# Patient Record
Sex: Female | Born: 1990 | Race: White | Hispanic: No | Marital: Single | Smoking: Never smoker
Health system: Southern US, Community
[De-identification: ages and names within clinical notes are randomized; demographics above are authoritative.]

## PROBLEM LIST (undated history)

## (undated) DIAGNOSIS — R51 Headache: Secondary | ICD-10-CM

## (undated) HISTORY — PX: NO PAST SURGERIES: SHX2092

---
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
## 2012-11-20 IMAGING — CR DG CHEST 2V
2 series · 2 of 2 positions shown · non-contrast
Comparison: None.

CLINICAL DATA: Postpartum.  Cough, sore throat, and fever

CHEST - 2 VIEW

[view not recorded (1 of 2)]
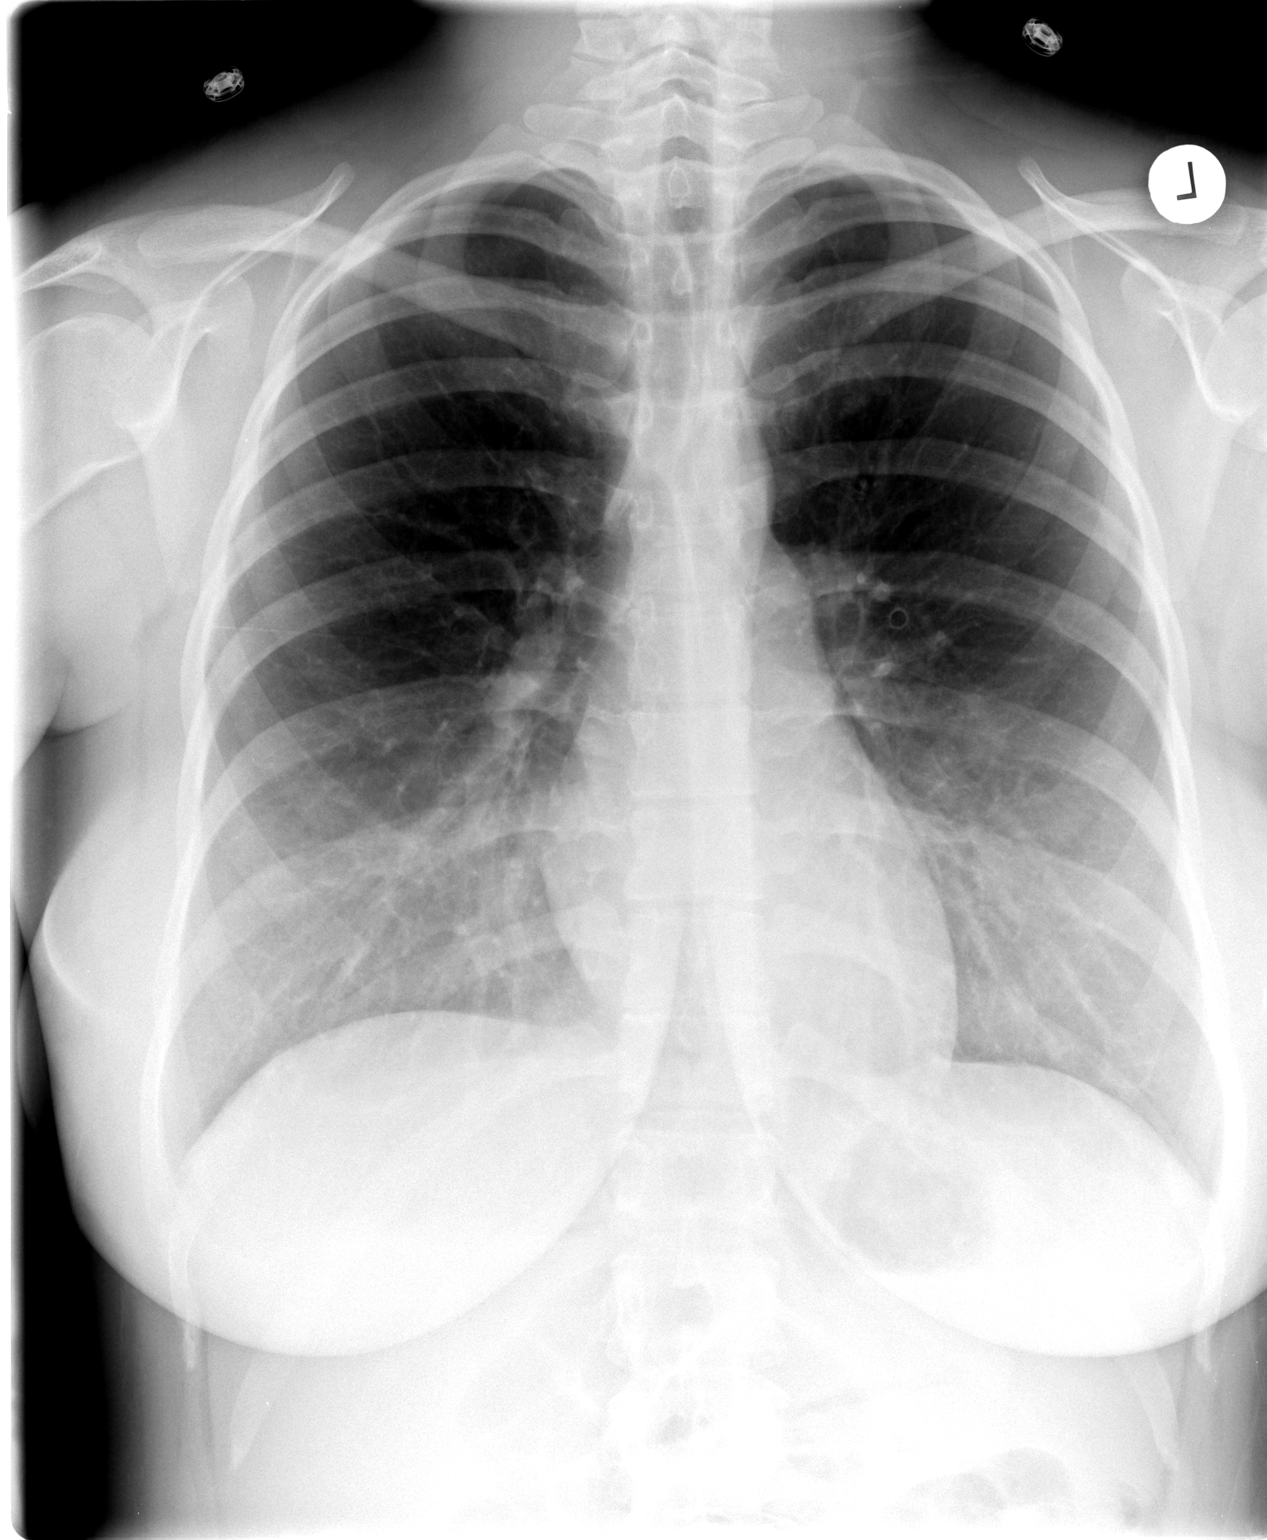

[view not recorded (2 of 2)]
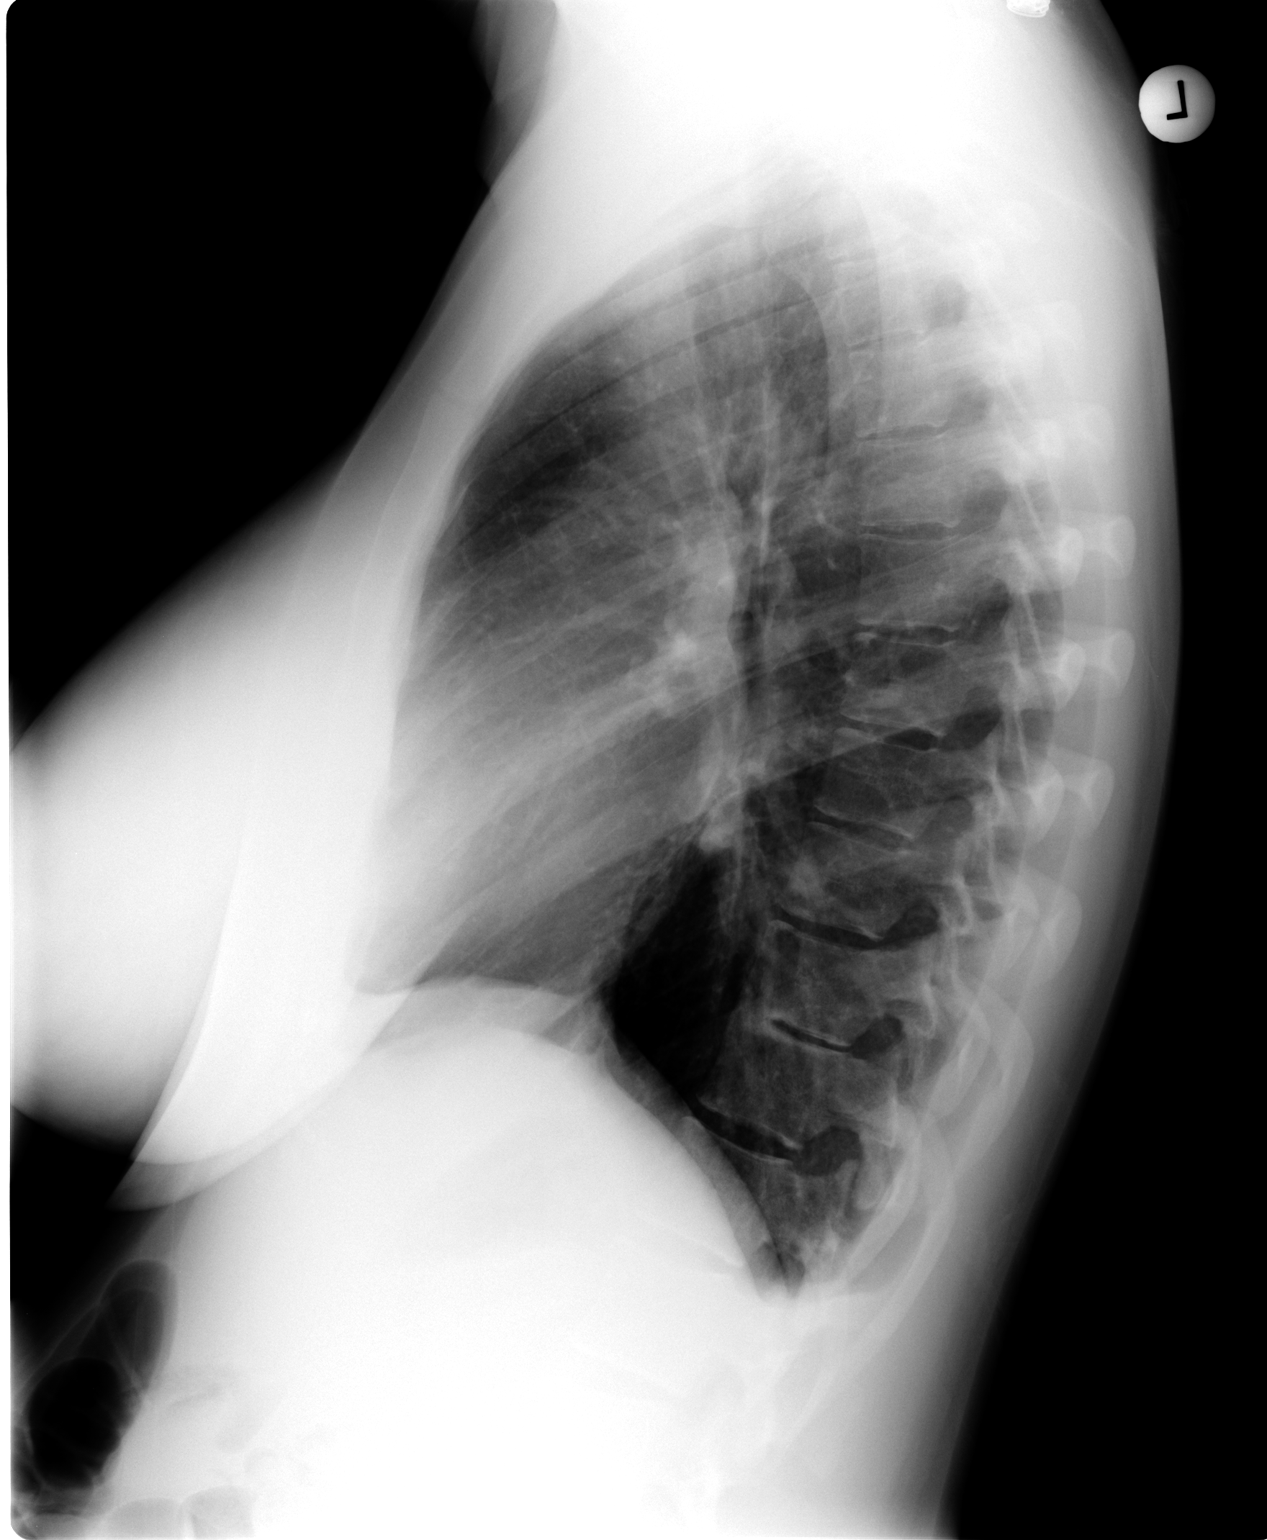

[2 of 2 positions shown; findings below may reference images not displayed]

FINDINGS: Normal heart, mediastinal, and hilar contours.  The lungs
are normally expanded and clear.  There is no pleural effusion or
pneumothorax.  The trachea is midline.  Bones are unremarkable.
IMPRESSION: No acute cardiopulmonary disease.

## 2012-12-28 ENCOUNTER — Encounter (HOSPITAL_COMMUNITY): Payer: Self-pay | Admitting: Emergency Medicine

## 2012-12-28 ENCOUNTER — Emergency Department (HOSPITAL_COMMUNITY)
Admission: EM | Admit: 2012-12-28 | Discharge: 2012-12-28 | Disposition: A | Discharge status: Home / Self Care | Payer: Self-pay | Attending: Emergency Medicine | Admitting: Emergency Medicine

## 2012-12-28 DIAGNOSIS — N390 Urinary tract infection, site not specified: Secondary | ICD-10-CM | POA: Insufficient documentation

## 2012-12-28 DIAGNOSIS — R350 Frequency of micturition: Secondary | ICD-10-CM | POA: Insufficient documentation

## 2012-12-28 DIAGNOSIS — R05 Cough: Secondary | ICD-10-CM | POA: Insufficient documentation

## 2012-12-28 DIAGNOSIS — R197 Diarrhea, unspecified: Secondary | ICD-10-CM | POA: Insufficient documentation

## 2012-12-28 DIAGNOSIS — R059 Cough, unspecified: Secondary | ICD-10-CM | POA: Insufficient documentation

## 2012-12-28 DIAGNOSIS — B349 Viral infection, unspecified: Secondary | ICD-10-CM

## 2012-12-28 DIAGNOSIS — Z3202 Encounter for pregnancy test, result negative: Secondary | ICD-10-CM | POA: Insufficient documentation

## 2012-12-28 DIAGNOSIS — B9789 Other viral agents as the cause of diseases classified elsewhere: Secondary | ICD-10-CM | POA: Insufficient documentation

## 2012-12-28 DIAGNOSIS — R112 Nausea with vomiting, unspecified: Secondary | ICD-10-CM | POA: Insufficient documentation

## 2012-12-28 LAB — COMPREHENSIVE METABOLIC PANEL
ALT: 16 U/L (ref 0–35)
AST: 26 U/L (ref 0–37)
Albumin: 4.2 g/dL (ref 3.5–5.2)
CO2: 23 mEq/L (ref 19–32)
Chloride: 104 mEq/L (ref 96–112)
GFR calc non Af Amer: >90 mL/min (ref >90)
Sodium: 138 mEq/L (ref 135–145)
Total Bilirubin: 0.3 mg/dL (ref 0.3–1.2)

## 2012-12-28 LAB — URINALYSIS, ROUTINE W REFLEX MICROSCOPIC
Glucose, UA: NEGATIVE mg/dL
Nitrite: POSITIVE — AB
Protein, ur: NEGATIVE mg/dL
Urobilinogen, UA: 1 mg/dL (ref 0.0–1.0)

## 2012-12-28 LAB — URINE MICROSCOPIC-ADD ON

## 2012-12-28 LAB — CBC WITH DIFFERENTIAL/PLATELET
Basophils Absolute: 0 10*3/uL (ref 0.0–0.1)
Lymphocytes Relative: 27 % (ref 12–46)
Neutro Abs: 4.9 10*3/uL (ref 1.7–7.7)
Neutrophils Relative %: 65 % (ref 43–77)
Platelets: 156 10*3/uL (ref 150–400)
RDW: 12.5 % (ref 11.5–15.5)
WBC: 7.6 10*3/uL (ref 4.0–10.5)

## 2012-12-28 MED ORDER — METRONIDAZOLE 500 MG PO TABS: 500.0000 mg | ORAL_TABLET | Freq: Two times a day (BID) | ORAL | Status: DC

## 2012-12-28 MED ORDER — POTASSIUM CHLORIDE CRYS ER 20 MEQ PO TBCR
40.0000 meq | EXTENDED_RELEASE_TABLET | Freq: Once | ORAL | Status: AC
  Administered 2012-12-28: 40 meq via ORAL
  Filled 2012-12-28: qty 2

## 2012-12-28 MED ORDER — PROMETHAZINE HCL 25 MG PO TABS: 25.0000 mg | ORAL_TABLET | Freq: Four times a day (QID) | ORAL | Status: DC | PRN

## 2012-12-28 MED ORDER — NITROFURANTOIN MONOHYD MACRO 100 MG PO CAPS: 100.0000 mg | ORAL_CAPSULE | Freq: Two times a day (BID) | ORAL | Status: DC

## 2012-12-28 MED ORDER — ONDANSETRON 4 MG PO TBDP
8.0000 mg | ORAL_TABLET | Freq: Once | ORAL | Status: AC
  Administered 2012-12-28: 8 mg via ORAL
  Filled 2012-12-28: qty 2

## 2012-12-28 NOTE — ED Provider Notes (Signed)
History     CSN: [id number]  Arrival date & time 12/28/12  7829   First MD Initiated Contact with Patient 12/28/12 2031      Chief Complaint  Patient presents with  . Emesis   HPI   history provided by the patient. Patient is a 22 year old female with no significant PMH who presents with complaints of nausea vomiting and diarrhea. Symptoms first began 2 days ago with worse symptoms yesterday and persisting into today. She denies any known sick contacts. Does report being ill with upper respiratory infection one month ago and states he has had some continued issues of congestion and drainage. Denies any undercooked or spoiled foods. No recent travel. Symptoms came on acutely. Diarrhea is soft and mushy without blood or mucus. Patient has been able to keep down some fluids but also vomits when drinking he any solid foods. Denies any hematemesis. Denies any significant abdominal pains but does report some bloating and increased gas. No other aggravating or alleviating factors. No other associated symptoms.    Past Medical History  Diagnosis Date  . Headache     Past Surgical History  Procedure Laterality Date  . No past surgeries      No family history on file.  History  Substance Use Topics  . Smoking status: Never Smoker   . Smokeless tobacco: Never Used  . Alcohol Use: No    OB History   Grav Para Term Preterm Abortions TAB SAB Ect Mult Living   1 1 1       1       Review of Systems  Constitutional: Negative for fever and chills.  HENT: Negative for sore throat.   Respiratory: Positive for cough. Negative for shortness of breath.   Gastrointestinal: Positive for nausea, vomiting and diarrhea. Negative for abdominal pain and blood in stool.  Genitourinary: Positive for frequency. Negative for dysuria, hematuria, flank pain, vaginal bleeding and vaginal discharge.  All other systems reviewed and are negative.    Allergies  Review of patient's allergies indicates no  known allergies.  Home Medications   Current Outpatient Rx  Name  Route  Sig  Dispense  Refill  . prenatal vitamin w/FE, FA (PRENATAL 1 + 1) 27-1 MG TABS   Oral   Take 1 tablet by mouth daily.             BP 150/81  Pulse 75  Temp(Src) 98.2 F (36.8 C) (Oral)  SpO2 100%  LMP 11/30/2012  Physical Exam  Nursing note and vitals reviewed. Constitutional: She is oriented to person, place, and time. She appears well-developed and well-nourished. No distress.  HENT:  Head: Normocephalic.  Mouth/Throat: Oropharynx is clear and moist.  Neck: Normal range of motion. Neck supple.  No meningeal signs  Cardiovascular: Normal rate and regular rhythm.   Pulmonary/Chest: Effort normal and breath sounds normal. No respiratory distress. She has no wheezes. She has no rales.  Abdominal: Soft. There is no tenderness. There is no rebound and no guarding.  Musculoskeletal: Normal range of motion.  Neurological: She is alert and oriented to person, place, and time.  Skin: Skin is warm and dry. No rash noted.  Psychiatric: She has a normal mood and affect. Her behavior is normal.    ED Course  Procedures   Results for orders placed during the hospital encounter of 12/28/12  URINALYSIS, ROUTINE W REFLEX MICROSCOPIC      Result Value Range   Color, Urine YELLOW  YELLOW   APPearance  HAZY (*) CLEAR   Specific Gravity, Urine 1.022  1.005 - 1.030   pH 6.5  5.0 - 8.0   Glucose, UA NEGATIVE  NEGATIVE mg/dL   Hgb urine dipstick NEGATIVE  NEGATIVE   Bilirubin Urine NEGATIVE  NEGATIVE   Ketones, ur NEGATIVE  NEGATIVE mg/dL   Protein, ur NEGATIVE  NEGATIVE mg/dL   Urobilinogen, UA 1.0  0.0 - 1.0 mg/dL   Nitrite POSITIVE (*) NEGATIVE   Leukocytes, UA SMALL (*) NEGATIVE  CBC WITH DIFFERENTIAL      Result Value Range   WBC 7.6  4.0 - 10.5 K/uL   RBC 4.65  3.87 - 5.11 MIL/uL   Hemoglobin 14.8  12.0 - 15.0 g/dL   HCT 16.1  09.6 - 04.5 %   MCV 90.3  78.0 - 100.0 fL   MCH 31.8  26.0 - 34.0 pg    MCHC 35.2  30.0 - 36.0 g/dL   RDW 40.9  81.1 - 91.4 %   Platelets 156  150 - 400 K/uL   Neutrophils Relative 65  43 - 77 %   Neutro Abs 4.9  1.7 - 7.7 K/uL   Lymphocytes Relative 27  12 - 46 %   Lymphs Abs 2.0  0.7 - 4.0 K/uL   Monocytes Relative 6  3 - 12 %   Monocytes Absolute 0.4  0.1 - 1.0 K/uL   Eosinophils Relative 2  0 - 5 %   Eosinophils Absolute 0.2  0.0 - 0.7 K/uL   Basophils Relative 1  0 - 1 %   Basophils Absolute 0.0  0.0 - 0.1 K/uL  COMPREHENSIVE METABOLIC PANEL      Result Value Range   Sodium 138  135 - 145 mEq/L   Potassium 3.3 (*) 3.5 - 5.1 mEq/L   Chloride 104  96 - 112 mEq/L   CO2 23  19 - 32 mEq/L   Glucose, Bld 113 (*) 70 - 99 mg/dL   BUN 13  6 - 23 mg/dL   Creatinine, Ser 7.82  0.50 - 1.10 mg/dL   Calcium 9.2  8.4 - 95.6 mg/dL   Total Protein 7.0  6.0 - 8.3 g/dL   Albumin 4.2  3.5 - 5.2 g/dL   AST 26  0 - 37 U/L   ALT 16  0 - 35 U/L   Alkaline Phosphatase 57  39 - 117 U/L   Total Bilirubin 0.3  0.3 - 1.2 mg/dL   GFR calc non Af Amer >90  >90 mL/min   GFR calc Af Amer >90  >90 mL/min  URINE MICROSCOPIC-ADD ON      Result Value Range   Squamous Epithelial / LPF FEW (*) RARE   WBC, UA 0-2  <3 WBC/hpf   Bacteria, UA MANY (*) RARE   Urine-Other MUCOUS PRESENT    POCT PREGNANCY, URINE      Result Value Range   Preg Test, Ur NEGATIVE  NEGATIVE         1. Nausea vomiting and diarrhea   2. UTI (lower urinary tract infection)   3. Viral syndrome       MDM  9:10PM patient seen and evaluated. Patient appears well in no acute distress. Does not appear severely ill or toxic.  Slight hypokalemia. Potassium given. Patient does report slight urinary frequency and urgency. UA with early signs of UTI. Will give prescription. Patient states she tends to develop BV after treatment for her urinary tract infections in the past. Will also give small  course of Flagyl if she develops symptoms.      Angus Seller, PA-C 12/28/12 2204

## 2012-12-28 NOTE — Discharge Instructions (Signed)
You were seen and evaluated for your nausea vomiting and diarrhea symptoms. Your urine test today showed signs concerning for possible early urinary tract infection. You have been given prescription for medications to help with this infection as well as prescriptions to help with your symptoms of nausea vomiting and diarrhea. Continue drink plenty of fluids to stay hydrated. Followup with her primary care provider for continued evaluation and treatment.    Viral Syndrome You or your child has Viral Syndrome. It is the most common infection causing "colds" and infections in the nose, throat, sinuses, and breathing tubes. Sometimes the infection causes nausea, vomiting, or diarrhea. The germ that causes the infection is a virus. No antibiotic or other medicine will kill it. There are medicines that you can take to make you or your child more comfortable.  HOME CARE INSTRUCTIONS   Rest in bed until you start to feel better.  If you have diarrhea or vomiting, eat small amounts of crackers and toast. Soup is helpful.  Do not give aspirin or medicine that contains aspirin to children.  Only take over-the-counter or prescription medicines for pain, discomfort, or fever as directed by your caregiver. SEEK IMMEDIATE MEDICAL CARE IF:   You or your child has not improved within one week.  You or your child has pain that is not at least partially relieved by over-the-counter medicine.  Thick, colored mucus or blood is coughed up.  Discharge from the nose becomes thick yellow or green.  Diarrhea or vomiting gets worse.  There is any major change in your or your child's condition.  You or your child develops a skin rash, stiff neck, severe headache, or are unable to hold down food or fluid.  You or your child has an oral temperature above 102 F (38.9 C), not controlled by medicine.  Your baby is older than 3 months with a rectal temperature of 102 F (38.9 C) or higher.  Your baby is 30 months  old or younger with a rectal temperature of 100.4 F (38 C) or higher. Document Released: 09/07/2006 Document Revised: 12/15/2011 Document Reviewed: 09/08/2007 [facility] Patient Information 2013 [place], [place].      Urinary Tract Infection A urinary tract infection (UTI) is often caused by a germ (bacteria). A UTI is usually helped with medicine (antibiotics) that kills germs. Take all the medicine until it is gone. Do this even if you are feeling better. You are usually better in 7 to 10 days. HOME CARE   Drink enough water and fluids to keep your pee (urine) clear or pale yellow. Drink:  Cranberry juice.  Water.  Avoid:  Caffeine.  Tea.  Bubbly (carbonated) drinks.  Alcohol.  Only take medicine as told by your doctor.  To prevent further infections:  Pee often.  After pooping (bowel movement), women should wipe from front to back. Use each tissue only once.  Pee before and after having sex (intercourse). Ask your doctor when your test results will be ready. Make sure you follow up and get your test results.  GET HELP RIGHT AWAY IF:   There is very bad back pain or lower belly (abdominal) pain.  You get the chills.  You have a fever.  Your baby is older than 3 months with a rectal temperature of 102 F (38.9 C) or higher.  Your baby is 28 months old or younger with a rectal temperature of 100.4 F (38 C) or higher.  You feel sick to your stomach (nauseous) or throw up (  vomit).  There is continued burning with peeing.  Your problems are not better in 3 days. Return sooner if you are getting worse. MAKE SURE YOU:   Understand these instructions.  Will watch your condition.  Will get help right away if you are not doing well or get worse. Document Released: 03/10/2008 Document Revised: 12/15/2011 Document Reviewed: 03/10/2008 [facility] Patient Information 2013 [place], [place].    RESOURCE GUIDE  Chronic Pain Problems: Contact [facility]  [phone] Patients need to be referred by their primary care doctor.  Insufficient Money for Medicine: Contact United Way:  call "211."   No Primary Care Doctor: - Call Health Connect  [phone] - can help you locate a primary care doctor that  accepts your insurance, provides certain services, etc. - Physician Referral Service- [phone]  Agencies that provide inexpensive medical care: - [facility]  [phone] - [facility]  [phone] - [facility]  [phone] - Women's Clinic  [phone] - Planned Parenthood  [phone] [facility]  [phone]  Medicaid-accepting [facility] Providers: - [facility]- [address]  [phone], Mon-Fri 9am-7pm, Sat 9am-1pm - [facility]- [address] [place], Suite [place]  [phone] - [facility]- [address] [place]  [phone] [facility]- [address]  [phone] - Renaye Rakers- [address] [place], [address], [phone]  Only accepts [place] patients after they have their name  applied to their card  Self Pay (no insurance) in [place]: - Sickle Cell Patients: Dr Willey Blade, [facility]  [address] [place], [phone] - [facility]- [address] [place]  [phone]       [facility]- [address]       -     [facility]- see information above (Speak to [employer] if you do not have insurance)       -  [facility]- [address],  [phone]       -  [facility]- [address], [phone]       -  Dr Julio Sicks-  [address], [place], [phone]       -  [facility] - [address] [phone]       -  [facility]- [address] [phone], also [address] [phone]       -    [facility]- [address] [place], [phone], 1st & 3rd Saturday        every month, 10am-1pm  [facility] [address] [place], [place] [address] [phone]  [facility] [address] [place], [place] [address] [phone]  1) Find a Doctor and Pay Out of Pocket Although you won't have to find out who is covered by your insurance plan, it is a good idea to ask around and get recommendations. You will then need to call the office and see if the doctor you have chosen will accept you as a new patient and what types of options they offer for patients who are self-pay. Some doctors offer discounts or will set up payment plans for their patients who do not have insurance, but you  will need to ask so you aren't surprised when you get to your appointment.  2) Contact Your Local Health Department Not all health departments have doctors that can see patients for sick visits, but many do, so it is worth a call to see if yours does. If you don't know where your local health department is, you can check in your phone book. The CDC also has a tool to help you locate your state's health department, and many state websites also have listings of all of their local health departments.  3) Find a Walk-in Clinic If your illness is not likely to be very severe or complicated, you may want to try a walk in clinic. These are popping up all over the country in pharmacies, drugstores, and shopping centers. They're usually staffed by nurse practitioners or physician assistants that have been trained to treat common illnesses and complaints. They're usually fairly quick and inexpensive. However, if you have serious medical issues or chronic medical problems, these are probably not your best option  STD Testing - [facility] Department of [facility], STD Clinic, [address], [place], phone [phone] or [phone].  Monday - Friday, call for an  appointment. [facility] Department of [employer], STD Clinic, [address], [place], phone [phone] or [phone].  Monday - Friday, call for an appointment.  Abuse/Neglect: [facility] Child Abuse Hotline 424-351-4601 [facility] Child Abuse Hotline 920-089-1690 (After Hours)  Emergency Shelter:  [facility] [phone]  Maternity Homes: - [facility] [phone] - [facility] [phone]  MRSA Hotline #:   (548)097-7897  Dental Assistance If unable to pay or uninsured, contact:  [facility]. to become qualified for the adult dental clinic.  Patients with Medicaid: [facility] [phone] [address], [phone] [address] [phone]  If unable to pay, or uninsured, contact [facility] [phone] in [place], [phone] in [place] [place]) to become qualified for the adult dental clinic  [facility] [address] [place], [place] [address] [phone] [email or web address]  Other Proofreader Services: - [facility]- [address] [place], [place], [place], [address], [phone], Ext. [phone], 2nd and 4th Thursday of the month at 6:30am.  10 clients each day by appointment, can sometimes see walk-in patients if someone does not show for an appointment. [facility]- [address] [place], [place], [address], [phone] - [facility] [address] [place], [place], [address], [phone] - [facility]- [phone] [facility] Health Department- [phone] [facility] Health [phone]       Behavioral Health Resources in the [facility]  Intensive Outpatient Programs: [facility]      [address] [place], [place] [phone] Both a day and evening program       [facility] Outpatient      [address]        [place], [place] [address] [phone]         ADS: [facility] [address] [place] [phone]  [facility] Mental Health ACCESS LINE: ([phone] or [phone] [address] [place], [place] [address] [email or web address]   Substance Abuse Resources: - [facility] Drug Services  ([phone] - [facility] [phone] - [facility] [phone] [facility] [phone] -  Residential & Outpatient Substance Abuse Program  [phone]  Psychological Services: [facility]  [phone] Services  [phone] - [place], [phone] [place]. [address] [place], ACCESS LINE: [phone] or [phone], [email or web address]  Mobile Crisis Teams:                                        Therapeutic Alternatives         Mobile Crisis Care Unit [phone]             [facility] [address] Ginette Otto [phone]                                         Interventionist [address] [place] [phone]  Self-Help/Support Groups: Mental Health Assoc. of [employer] of support groups 619-147-1134 (call for more info)   Narcotics Anonymous (NA) Caring Services [address] [place] - 2 meetings at this location  Residential Treatment Programs:  [facility]      [address]        [place]       [phone]         [facility] [address] [place] [address]31 [place], [place]  [address] [phone]  [facility]  [address] [place], [place] [address] [phone] Admissions: 8am-3pm M-F  [facility]     [address]        [place], [place] [address]       [phone]         [facility] [address] [place], [place] [phone]  [facility] [address] [place], [place] [phone]  [facility] - Intensive Outpatient      [address]     [place], [place]       [phone]         [facility] ([facility]iction Recovery [facility].)     [address] [place], [place] [phone] or [phone]  Residential Treatment Services (RTS), Medicaid [address] [place], [place] [phone]  [facility]ship [address]                                               [address] [place] [phone]  [facility] Resources: [phone]               General Therapy                                                Angie Fava, PhD        [address] [place], [place] [address]         [phone]   Insurance  Patrcia Dolly  [facility] Behavioral   [address] [place], [place] [address] [phone]  [facility] [address] [place], [place] [address] [phone] Insurance/Medicaid/sponsorship through [facility] and Families                                              [address]                                        [place], [place] [address]    Therapy/tele-psych/case         [phone]          [facility] [address] [place], [place]  [address]  Adolescent/group home/case management [phone]                                           Creola Corn PhD       General therapy       Insurance   [phone]         Dr. Lolly Mustache, Insurance, M-F [phone]  [facility] [place]  United Way [facility] Dept. [address]                 [address]         [address] [place] [address]  Blondell Reveal Phone:  [phone]                                  Phone:  [phone]                   Phone:  [phone]  [facility] Mental  Health, [phone] - [facility] - [facility]- [phone]       -     [facility] in [place], [address],             [phone], Insurance  [place] Child Abuse Hotline [phone]) [phone] or [phone] (After Hours)

## 2012-12-28 NOTE — ED Notes (Signed)
PT. REPORTS NAUSEA / EMESIS X3 THIS MORNING , ALSO REPORTS HEADACHE , PRODUCTIVE COUGH , CHEST CONGESTION  AND NASAL CONGESTION .

## 2012-12-29 NOTE — ED Provider Notes (Signed)
Medical screening examination/treatment/procedure(s) were performed by non-physician practitioner and as supervising physician I was immediately available for consultation/collaboration.   Joya Gaskins, MD 12/29/12 [phone]

## 2013-02-17 ENCOUNTER — Emergency Department (HOSPITAL_COMMUNITY)
Admission: EM | Admit: 2013-02-17 | Discharge: 2013-02-17 | Disposition: A | Discharge status: Home / Self Care | Payer: Self-pay | Attending: Emergency Medicine | Admitting: Emergency Medicine

## 2013-02-17 ENCOUNTER — Encounter (HOSPITAL_COMMUNITY): Payer: Self-pay | Admitting: Cardiology

## 2013-02-17 DIAGNOSIS — S0993XA Unspecified injury of face, initial encounter: Secondary | ICD-10-CM | POA: Insufficient documentation

## 2013-02-17 DIAGNOSIS — M542 Cervicalgia: Secondary | ICD-10-CM

## 2013-02-17 DIAGNOSIS — S199XXA Unspecified injury of neck, initial encounter: Secondary | ICD-10-CM | POA: Insufficient documentation

## 2013-02-17 DIAGNOSIS — W19XXXA Unspecified fall, initial encounter: Secondary | ICD-10-CM

## 2013-02-17 DIAGNOSIS — S0990XA Unspecified injury of head, initial encounter: Secondary | ICD-10-CM | POA: Insufficient documentation

## 2013-02-17 DIAGNOSIS — Y92410 Unspecified street and highway as the place of occurrence of the external cause: Secondary | ICD-10-CM | POA: Insufficient documentation

## 2013-02-17 DIAGNOSIS — Y9389 Activity, other specified: Secondary | ICD-10-CM | POA: Insufficient documentation

## 2013-02-17 MED ORDER — METHOCARBAMOL 500 MG PO TABS: 500.0000 mg | ORAL_TABLET | Freq: Two times a day (BID) | ORAL | Status: DC
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
## 2013-02-17 NOTE — ED Notes (Signed)
Pt in peds with son

## 2013-02-17 NOTE — ED Provider Notes (Signed)
Medical screening examination/treatment/procedure(s) were performed by non-physician practitioner and as supervising physician I was immediately available for consultation/collaboration.   Nyle Limb III, MD 02/17/13 1920 
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
## 2013-02-17 NOTE — ED Notes (Signed)
Pt still in PEDS with her child. 

## 2013-02-17 NOTE — ED Notes (Signed)
Pt currently in Peds with her child.

## 2013-04-16 ENCOUNTER — Emergency Department (HOSPITAL_COMMUNITY)
Admission: EM | Admit: 2013-04-16 | Discharge: 2013-04-18 | Disposition: A | Discharge status: Home / Self Care | Payer: Self-pay | Attending: Emergency Medicine | Admitting: Emergency Medicine

## 2013-04-16 DIAGNOSIS — Z3202 Encounter for pregnancy test, result negative: Secondary | ICD-10-CM | POA: Insufficient documentation

## 2013-04-16 DIAGNOSIS — F4310 Post-traumatic stress disorder, unspecified: Secondary | ICD-10-CM | POA: Diagnosis present

## 2013-04-16 DIAGNOSIS — F39 Unspecified mood [affective] disorder: Secondary | ICD-10-CM | POA: Diagnosis present

## 2013-04-16 DIAGNOSIS — F419 Anxiety disorder, unspecified: Secondary | ICD-10-CM

## 2013-04-16 DIAGNOSIS — Z91040 Latex allergy status: Secondary | ICD-10-CM | POA: Insufficient documentation

## 2013-04-16 DIAGNOSIS — F411 Generalized anxiety disorder: Secondary | ICD-10-CM | POA: Insufficient documentation

## 2013-04-16 DIAGNOSIS — R45851 Suicidal ideations: Secondary | ICD-10-CM | POA: Insufficient documentation

## 2013-04-16 DIAGNOSIS — F332 Major depressive disorder, recurrent severe without psychotic features: Secondary | ICD-10-CM | POA: Insufficient documentation

## 2013-04-17 ENCOUNTER — Encounter (HOSPITAL_COMMUNITY): Payer: Self-pay | Admitting: *Deleted

## 2013-04-17 DIAGNOSIS — F39 Unspecified mood [affective] disorder: Secondary | ICD-10-CM

## 2013-04-17 DIAGNOSIS — F4310 Post-traumatic stress disorder, unspecified: Secondary | ICD-10-CM | POA: Diagnosis present

## 2013-04-17 LAB — COMPREHENSIVE METABOLIC PANEL
AST: 22 U/L (ref 0–37)
Albumin: 4.6 g/dL (ref 3.5–5.2)
Alkaline Phosphatase: 59 U/L (ref 39–117)
CO2: 26 mEq/L (ref 19–32)
Chloride: 104 mEq/L (ref 96–112)
Creatinine, Ser: 0.64 mg/dL (ref 0.50–1.10)
GFR calc non Af Amer: >90 mL/min (ref >90)
Potassium: 3.4 mEq/L — ABNORMAL LOW (ref 3.5–5.1)
Total Bilirubin: 0.5 mg/dL (ref 0.3–1.2)

## 2013-04-17 LAB — RAPID URINE DRUG SCREEN, HOSP PERFORMED
Amphetamines: NOT DETECTED
Barbiturates: NOT DETECTED
Opiates: NOT DETECTED
Tetrahydrocannabinol: POSITIVE — AB

## 2013-04-17 LAB — CBC
HCT: 44.3 % (ref 36.0–46.0)
MCV: 91.5 fL (ref 78.0–100.0)
Platelets: 164 10*3/uL (ref 150–400)
RBC: 4.84 MIL/uL (ref 3.87–5.11)
WBC: 5.8 10*3/uL (ref 4.0–10.5)

## 2013-04-17 LAB — POCT PREGNANCY, URINE: Preg Test, Ur: NEGATIVE

## 2013-04-17 MED ORDER — LORAZEPAM 1 MG PO TABS: 1.0000 mg | ORAL_TABLET | Freq: Three times a day (TID) | ORAL | Status: DC | PRN

## 2013-04-17 MED ORDER — DIVALPROEX SODIUM 250 MG PO DR TAB
250.0000 mg | DELAYED_RELEASE_TABLET | Freq: Two times a day (BID) | ORAL | Status: DC
  Administered 2013-04-17 – 2013-04-18 (×3): 250 mg via ORAL
  Filled 2013-04-17 (×3): qty 1

## 2013-04-17 NOTE — ED Notes (Signed)
telepsych consult faxed
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
## 2013-04-17 NOTE — ED Notes (Signed)
telepsych completed 

## 2013-04-17 NOTE — ED Notes (Signed)
Pt c/o SI due to increased stress x 1 week. Pt denies self-harm and plan. Pt denies ETOH and drug use.

## 2013-04-17 NOTE — ED Notes (Signed)
Pts friends: Fonnie Jarvis [phone], JWJXB [phone]
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
## 2013-04-17 NOTE — BH Assessment (Signed)
[facility] Assessment Progress Note    Shavon accepted pt to [facility] to a 500 hall assignment to the services of Dr. Elsie Saas.  Bed is not available and pt most likely will be in ED until 04-18-12 when there will be discharges on this floor.  Inetta Fermo and assessment dept was notified on 04-17-13
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
## 2013-04-18 MED ORDER — DIVALPROEX SODIUM 250 MG PO DR TAB: 250.0000 mg | DELAYED_RELEASE_TABLET | Freq: Two times a day (BID) | ORAL | Status: DC

## 2013-04-18 NOTE — ED Provider Notes (Signed)
8:47 AM  Filed Vitals:   04/18/13 0500  BP: 97/59  Pulse: 66  Temp:   Resp: 16   Pt seen this morning. No complaints currently. She currently has no suicidal ideation or intent. No access to firearms. No previous attempt. No self injurious behavior.  Denies family hx of suicide attempt. Does have some conflict with her boyfriend/family and recently lost job. She denies drug/etoh abuse (THC noted on UDS). She states she feels safe to go home. Says it was just a "stupid" gesture though. Since ruminating on it, she realizes how much she has to live for. Pt has been seen by psychiatry and recommending inpt tx. Pt would like to go home and follow-up as outpt. I feel she is safe to do so. Will be given resource list.    Raeford Razor, MD 04/18/13 [phone]

## 2013-06-27 ENCOUNTER — Emergency Department (HOSPITAL_COMMUNITY)
Admission: EM | Admit: 2013-06-27 | Discharge: 2013-06-27 | Disposition: A | Discharge status: Home / Self Care | Payer: Medicaid Other | Attending: Emergency Medicine | Admitting: Emergency Medicine

## 2013-06-27 ENCOUNTER — Encounter (HOSPITAL_COMMUNITY): Payer: Self-pay | Admitting: Emergency Medicine

## 2013-06-27 DIAGNOSIS — J029 Acute pharyngitis, unspecified: Secondary | ICD-10-CM | POA: Insufficient documentation

## 2013-06-27 DIAGNOSIS — Z91040 Latex allergy status: Secondary | ICD-10-CM | POA: Insufficient documentation

## 2013-06-27 LAB — RAPID STREP SCREEN (MED CTR MEBANE ONLY): Streptococcus, Group A Screen (Direct): NEGATIVE

## 2013-06-27 NOTE — ED Notes (Signed)
Pt c/o sore throat x 4 days with some white patches

## 2013-06-27 NOTE — ED Provider Notes (Signed)
CSN: [id number]     Arrival date & time 06/27/13  1123 History  This chart was scribed for Jaynie Crumble, PA, working with Candyce Churn, MD by Blanchard Kelch, ED Scribe. This patient was seen in room TR07C/TR07C and the patient's care was started at 12:02 PM.    Chief Complaint  Patient presents with  . Sore Throat    Patient is a 22 y.o. female presenting with pharyngitis. The history is provided by the patient. No language interpreter was used.  Sore Throat    HPI Comments: Sydney Allison is a 22 y.o. female who presents to the Emergency Department complaining of gradually worsening, constant sore throat that began three days ago. She reports there are white patches present on the back of her throat. She has taken ibuprofen for the pain with mild relief. She states that her boyfriend also has a sore throat. She denies congestion, abnormal cough or fever.    Past Medical History  Diagnosis Date  . XBMWUXLK(440.1)    Past Surgical History  Procedure Laterality Date  . No past surgeries     History reviewed. No pertinent family history. History  Substance Use Topics  . Smoking status: Never Smoker   . Smokeless tobacco: Never Used  . Alcohol Use: No   OB History   Grav Para Term Preterm Abortions TAB SAB Ect Mult Living   1 1 1       1      Review of Systems  Constitutional: Negative for fever.  HENT: Positive for sore throat. Negative for congestion.   Respiratory: Negative for cough.   All other systems reviewed and are negative.    Allergies  Latex  Home Medications   No current outpatient prescriptions on file. Triage Vitals: BP 118/96  Pulse 99  Temp(Src) 98.8 F (37.1 C) (Oral)  Resp 18  Ht 5\' 2"  (1.575 m)  Wt 123 lb (55.792 kg)  BMI 22.49 kg/m2  SpO2 100%  Physical Exam  Nursing note and vitals reviewed. Constitutional: She is oriented to person, place, and time. She appears well-developed and well-nourished. No distress.  HENT:   Head: Normocephalic and atraumatic.  Right Ear: Tympanic membrane normal.  Left Ear: Tympanic membrane normal.  Mouth/Throat: Uvula is midline.  Pharynx erythematous. Tonsils enlarged. No exudate. Uvula midline  Eyes: Conjunctivae and EOM are normal.  Neck: Normal range of motion. Neck supple. No tracheal deviation present.  Cardiovascular: Normal rate, regular rhythm and normal heart sounds.   Pulmonary/Chest: Effort normal and breath sounds normal. No respiratory distress.  Musculoskeletal: Normal range of motion.  Neurological: She is alert and oriented to person, place, and time.  Skin: Skin is warm and dry.  Psychiatric: She has a normal mood and affect. Her behavior is normal.    ED Course  Procedures (including critical care time)  DIAGNOSTIC STUDIES: Oxygen Saturation is 10% on room air, normal by my interpretation.    COORDINATION OF CARE:  12:05 PM - Will order rapid strep screen. Patient verbalizes understanding and agrees with treatment plan.   Labs Review Labs Reviewed  RAPID STREP SCREEN  CULTURE, GROUP A STREP   Imaging Review No results found.  MDM   1. Pharyngitis     Pt with sore throat, no other symptoms. VS normal here. No signs of peritonsillar abscess. Mild erythema noted over pharynx. No exudate. Strep screen negative. Suspect viral pharyngitis, possibly mono. Discussed with pt. Will d/c home with follow up.   Filed Vitals:  06/27/13 1141  BP: 118/96  Pulse: 99  Temp: 98.8 F (37.1 C)  TempSrc: Oral  Resp: 18  Height: 5\' 2"  (1.575 m)  Weight: 123 lb (55.792 kg)  SpO2: 100%   I personally performed the services described in this documentation, which was scribed in my presence. The recorded information has been reviewed and is accurate.      Lottie Mussel, PA-C 06/27/13 1521

## 2013-06-29 LAB — CULTURE, GROUP A STREP

## 2013-06-29 NOTE — ED Provider Notes (Signed)
Medical screening examination/treatment/procedure(s) were performed by non-physician practitioner and as supervising physician I was immediately available for consultation/collaboration.    Sullivan Blasing David Rewa Weissberg, MD 06/29/13 1006 

## 2014-02-27 ENCOUNTER — Encounter (HOSPITAL_COMMUNITY): Payer: Self-pay | Admitting: Emergency Medicine

## 2014-02-27 ENCOUNTER — Emergency Department (HOSPITAL_COMMUNITY)
Admission: EM | Admit: 2014-02-27 | Discharge: 2014-02-27 | Disposition: A | Discharge status: Home / Self Care | Payer: No Typology Code available for payment source | Attending: Emergency Medicine | Admitting: Emergency Medicine

## 2014-02-27 DIAGNOSIS — R609 Edema, unspecified: Secondary | ICD-10-CM | POA: Insufficient documentation

## 2014-02-27 DIAGNOSIS — Z3202 Encounter for pregnancy test, result negative: Secondary | ICD-10-CM | POA: Insufficient documentation

## 2014-02-27 DIAGNOSIS — Z91040 Latex allergy status: Secondary | ICD-10-CM | POA: Insufficient documentation

## 2014-02-27 DIAGNOSIS — N390 Urinary tract infection, site not specified: Secondary | ICD-10-CM

## 2014-02-27 DIAGNOSIS — R110 Nausea: Secondary | ICD-10-CM | POA: Insufficient documentation

## 2014-02-27 LAB — CBC WITH DIFFERENTIAL/PLATELET
BASOS ABS: 0 10*3/uL (ref 0.0–0.1)
BASOS PCT: 0 % (ref 0–1)
EOS ABS: 0.2 10*3/uL (ref 0.0–0.7)
EOS PCT: 4 % (ref 0–5)
HEMATOCRIT: 42.9 % (ref 36.0–46.0)
Hemoglobin: 15.1 g/dL — ABNORMAL HIGH (ref 12.0–15.0)
Lymphocytes Relative: 34 % (ref 12–46)
Lymphs Abs: 1.9 10*3/uL (ref 0.7–4.0)
MCH: 32.5 pg (ref 26.0–34.0)
MCHC: 35.2 g/dL (ref 30.0–36.0)
MCV: 92.5 fL (ref 78.0–100.0)
MONO ABS: 0.4 10*3/uL (ref 0.1–1.0)
Monocytes Relative: 7 % (ref 3–12)
Neutro Abs: 3.1 10*3/uL (ref 1.7–7.7)
Neutrophils Relative %: 55 % (ref 43–77)
PLATELETS: 159 10*3/uL (ref 150–400)
RBC: 4.64 MIL/uL (ref 3.87–5.11)
RDW: 12.2 % (ref 11.5–15.5)
WBC: 5.7 10*3/uL (ref 4.0–10.5)

## 2014-02-27 LAB — URINE MICROSCOPIC-ADD ON

## 2014-02-27 LAB — URINALYSIS, ROUTINE W REFLEX MICROSCOPIC
BILIRUBIN URINE: NEGATIVE
Glucose, UA: NEGATIVE mg/dL
HGB URINE DIPSTICK: NEGATIVE
KETONES UR: NEGATIVE mg/dL
Leukocytes, UA: NEGATIVE
Nitrite: POSITIVE — AB
PROTEIN: NEGATIVE mg/dL
Specific Gravity, Urine: 1.028 (ref 1.005–1.030)
UROBILINOGEN UA: 1 mg/dL (ref 0.0–1.0)
pH: 6 (ref 5.0–8.0)

## 2014-02-27 LAB — COMPREHENSIVE METABOLIC PANEL
ALT: 16 U/L (ref 0–35)
AST: 24 U/L (ref 0–37)
Albumin: 4.5 g/dL (ref 3.5–5.2)
Alkaline Phosphatase: 50 U/L (ref 39–117)
BUN: 12 mg/dL (ref 6–23)
CALCIUM: 9.3 mg/dL (ref 8.4–10.5)
CO2: 26 mEq/L (ref 19–32)
CREATININE: 0.58 mg/dL (ref 0.50–1.10)
Chloride: 105 mEq/L (ref 96–112)
GFR calc Af Amer: >90 mL/min (ref >90)
Glucose, Bld: 94 mg/dL (ref 70–99)
Potassium: 4.2 mEq/L (ref 3.7–5.3)
SODIUM: 143 meq/L (ref 137–147)
TOTAL PROTEIN: 7.5 g/dL (ref 6.0–8.3)
Total Bilirubin: 0.5 mg/dL (ref 0.3–1.2)

## 2014-02-27 MED ORDER — SULFAMETHOXAZOLE-TMP DS 800-160 MG PO TABS: 1.0000 | ORAL_TABLET | Freq: Two times a day (BID) | ORAL | Status: DC

## 2014-02-27 MED ORDER — SULFAMETHOXAZOLE-TMP DS 800-160 MG PO TABS
1.0000 | ORAL_TABLET | Freq: Once | ORAL | Status: AC
  Administered 2014-02-27: 1 via ORAL
  Filled 2014-02-27: qty 1

## 2014-02-27 MED ORDER — PHENAZOPYRIDINE HCL 200 MG PO TABS: 200.0000 mg | ORAL_TABLET | Freq: Three times a day (TID) | ORAL | Status: DC

## 2014-02-27 MED ORDER — PHENAZOPYRIDINE HCL 100 MG PO TABS
200.0000 mg | ORAL_TABLET | Freq: Once | ORAL | Status: AC
  Administered 2014-02-27: 200 mg via ORAL
  Filled 2014-02-27: qty 2

## 2014-02-27 NOTE — ED Notes (Signed)
Pt was able to void to get urine but only ~ 40 cc

## 2014-02-27 NOTE — ED Notes (Signed)
Scan done nothing in bladder

## 2014-02-27 NOTE — ED Provider Notes (Signed)
CSN: [id number]     Arrival date & time 02/27/14  1159 History   First MD Initiated Contact with Patient 02/27/14 1358     Chief Complaint  Patient presents with  . Urinary Retention     (Consider location/radiation/quality/duration/timing/severity/associated sxs/prior Treatment) HPI Comments: Patient states, for the past, week.  She's noticed decreased volume of her urine, and intermittent, epigastric, or flank pain.  That lasts for several seconds to a minute.  It is either epigastric or flank but never together.  When she has the pain.  She reports nausea, but no vomiting.  Reports normal appetite, normal bowel movements.  Denies any fever, although she does admit to drinking less fluids than normal.  The history is provided by the patient.    Past Medical History  Diagnosis Date  . EZMOQHUT(654.6)    Past Surgical History  Procedure Laterality Date  . No past surgeries     No family history on file. History  Substance Use Topics  . Smoking status: Never Smoker   . Smokeless tobacco: Never Used  . Alcohol Use: No   OB History   Grav Para Term Preterm Abortions TAB SAB Ect Mult Living   1 1 1       1      Review of Systems  Constitutional: Negative for fever and chills.  Respiratory: Negative for shortness of breath.   Cardiovascular: Negative for chest pain.  Gastrointestinal: Positive for abdominal pain.  Genitourinary: Positive for dysuria, flank pain and decreased urine volume. Negative for frequency.  Skin: Negative for rash.  Neurological: Negative for dizziness and headaches.  All other systems reviewed and are negative.     Allergies  Latex  Home Medications   Prior to Admission medications   Not on File   BP 137/94  Pulse 97  Temp(Src) 98.5 F (36.9 C) (Oral)  Resp 18  SpO2 100% Physical Exam  Nursing note and vitals reviewed. Constitutional: She is oriented to person, place, and time. She appears well-developed and well-nourished. No  distress.  HENT:  Head: Normocephalic and atraumatic.  Eyes: Pupils are equal, round, and reactive to light.  Neck: Normal range of motion.  Cardiovascular: Normal rate.   Pulmonary/Chest: Effort normal and breath sounds normal.  Abdominal: Soft. She exhibits no distension. There is no tenderness.  At this time.  Patient is not having any pain  Musculoskeletal: She exhibits edema.  Neurological: She is alert and oriented to person, place, and time.    ED Course  Procedures (including critical care time) Labs Review Labs Reviewed  URINALYSIS, ROUTINE W REFLEX MICROSCOPIC - Abnormal; Notable for the following:    Color, Urine AMBER (*)    APPearance HAZY (*)    Nitrite POSITIVE (*)    All other components within normal limits  CBC WITH DIFFERENTIAL - Abnormal; Notable for the following:    Hemoglobin 15.1 (*)    All other components within normal limits  URINE MICROSCOPIC-ADD ON - Abnormal; Notable for the following:    Bacteria, UA MANY (*)    All other components within normal limits  COMPREHENSIVE METABOLIC PANEL  POC URINE PREG, ED    Imaging Review No results found.   EKG Interpretation None      MDM  Review of patient's lab work shows that she has a urinary tract, infection.  Encourage patient to drink more fluids start her on Pyridium and Septra.  She's been instructed to return if she has new worsening.  Symptoms and  to drink plenty of fluids Final diagnoses:  UTI (lower urinary tract infection)         Arman FilterGail K Janeil Schexnayder, NP 02/27/14 1521

## 2014-02-27 NOTE — ED Notes (Signed)
Left flank pain and voiding very little denies vag d/c

## 2014-02-27 NOTE — ED Notes (Signed)
Pt presents to department for evaluation of urinary retention. States she hasn't been able to urinate x1 week. Reports diffuse abdominal pain. 10/10 pain upon arrival to ED. Pt is alert and oriented x4.

## 2014-02-27 NOTE — Discharge Instructions (Signed)
Take the antibiotic as directed until, completed.  As discussed.  Her radial make your urine orange, but will help with the discomfort

## 2014-02-27 NOTE — ED Provider Notes (Signed)
Medical screening examination/treatment/procedure(s) were performed by non-physician practitioner and as supervising physician I was immediately available for consultation/collaboration.   EKG Interpretation None       Kaleiyah Polsky, MD 02/27/14 1711 

## 2014-04-16 ENCOUNTER — Emergency Department (HOSPITAL_COMMUNITY)
Admission: EM | Admit: 2014-04-16 | Discharge: 2014-04-17 | Disposition: A | Discharge status: Home / Self Care | Payer: No Typology Code available for payment source | Attending: Emergency Medicine | Admitting: Emergency Medicine

## 2014-04-16 DIAGNOSIS — T63461A Toxic effect of venom of wasps, accidental (unintentional), initial encounter: Secondary | ICD-10-CM | POA: Insufficient documentation

## 2014-04-16 DIAGNOSIS — Z791 Long term (current) use of non-steroidal anti-inflammatories (NSAID): Secondary | ICD-10-CM | POA: Insufficient documentation

## 2014-04-16 DIAGNOSIS — Z792 Long term (current) use of antibiotics: Secondary | ICD-10-CM | POA: Insufficient documentation

## 2014-04-16 DIAGNOSIS — M79609 Pain in unspecified limb: Secondary | ICD-10-CM | POA: Insufficient documentation

## 2014-04-16 DIAGNOSIS — Z91040 Latex allergy status: Secondary | ICD-10-CM | POA: Insufficient documentation

## 2014-04-16 DIAGNOSIS — Y939 Activity, unspecified: Secondary | ICD-10-CM | POA: Insufficient documentation

## 2014-04-16 DIAGNOSIS — W57XXXA Bitten or stung by nonvenomous insect and other nonvenomous arthropods, initial encounter: Secondary | ICD-10-CM

## 2014-04-16 DIAGNOSIS — Y929 Unspecified place or not applicable: Secondary | ICD-10-CM | POA: Insufficient documentation

## 2014-04-16 DIAGNOSIS — T6391XA Toxic effect of contact with unspecified venomous animal, accidental (unintentional), initial encounter: Secondary | ICD-10-CM | POA: Insufficient documentation

## 2014-04-16 DIAGNOSIS — Z79899 Other long term (current) drug therapy: Secondary | ICD-10-CM | POA: Insufficient documentation

## 2014-04-16 DIAGNOSIS — M79672 Pain in left foot: Secondary | ICD-10-CM

## 2014-04-16 MED ORDER — KETOROLAC TROMETHAMINE 30 MG/ML IJ SOLN
30.0000 mg | Freq: Once | INTRAMUSCULAR | Status: DC
  Filled 2014-04-16: qty 1

## 2014-04-16 MED ORDER — KETOROLAC TROMETHAMINE 30 MG/ML IJ SOLN
30.0000 mg | Freq: Once | INTRAMUSCULAR | Status: AC
  Administered 2014-04-16: 30 mg via INTRAMUSCULAR

## 2014-04-16 MED ORDER — DIPHENHYDRAMINE HCL 25 MG PO CAPS
50.0000 mg | ORAL_CAPSULE | Freq: Once | ORAL | Status: AC
  Administered 2014-04-16: 50 mg via ORAL
  Filled 2014-04-16: qty 2

## 2014-04-16 NOTE — ED Notes (Signed)
Patient here with bee sting to left foot between 2nd and 3rd toe. Patient explains that initially it hurt but swelling was confined to toes. Gradually foot has swollen since that time to include most of foot. Currently sting site is barely visible and foot is reddened. Pulse intact. Patient ambulatory but with pain.

## 2014-04-16 NOTE — ED Provider Notes (Signed)
CSN: [id number]     Arrival date & time 04/16/14  2217 History  This chart was scribed for non-physician provider Terri Piedraourtney Forcucci, PA-C, working with Juliet RudeNathan R. Rubin PayorPickering, MD, by Phillis HaggisGabriella Gaje, ED Scribe. This patient was seen in room TR11C/TR11C and patient care was started at 11:40 PM.   Chief Complaint  Patient presents with  . Insect Bite  . Foot Swelling   The history is provided by the patient. No language interpreter was used.   HPI Comments: Sydney Allison is a 23 y.o. female who presents to the Emergency Department complaining of a left foot bee sting onset 2 days ago. She reports that she stepped on a bee, got stung on her 3rd toe, and her foot started swelling. She reports that the pain is radiating, currently a 6/10, but at worst 10/10. She reports associated swelling and burning when she itches the area. She states that she does not know if she is allergic to bee venom and has not experienced this reaction before. She reports an allergy to pollen and latex. She reports that she has taken trematol for the pain with some relief and has been elevating her leg to relieve the swelling.  She states that the pain worsens with walking and is unable to stand on her foot. She denies fever, chills, nausea or vomiting. She denies history of medical problems or daily medications.  She states that she does not have a PCP.   Past Medical History  Diagnosis Date  . OZHYQMVH(846.9Headache(784.0)    Past Surgical History  Procedure Laterality Date  . No past surgeries     No family history on file. History  Substance Use Topics  . Smoking status: Never Smoker   . Smokeless tobacco: Never Used  . Alcohol Use: No   OB History   Grav Para Term Preterm Abortions TAB SAB Ect Mult Living   1 1 1       1      Review of Systems  Constitutional: Negative for fever and chills.  Gastrointestinal: Negative for nausea and vomiting.  Skin: Positive for rash (bee sting).  All other systems reviewed and  are negative.  Allergies  Latex  Home Medications   Prior to Admission medications   Medication Sig Start Date End Date Taking? Authorizing Provider  cetirizine (ZYRTEC ALLERGY) 10 MG tablet Take 1 tablet (10 mg total) by mouth at bedtime. 04/17/14   Rosela Supak A Forcucci, PA-C  naproxen (NAPROSYN) 500 MG tablet Take 1 tablet (500 mg total) by mouth 2 (two) times daily. 04/17/14   Karlton Maya A Forcucci, PA-C  phenazopyridine (PYRIDIUM) 200 MG tablet Take 1 tablet (200 mg total) by mouth 3 (three) times daily with meals. 02/27/14   Arman FilterGail K Schulz, NP  sulfamethoxazole-trimethoprim (BACTRIM DS) 800-160 MG per tablet Take 1 tablet by mouth 2 (two) times daily. 02/27/14   Arman FilterGail K Schulz, NP   BP 123/57  Pulse 88  Temp(Src) 98.4 F (36.9 C) (Oral)  Ht 5\' 2"  (1.575 m)  Wt 125 lb (56.7 kg)  BMI 22.86 kg/m2  SpO2 98%  LMP 03/10/2014 Physical Exam  Nursing note and vitals reviewed. Constitutional: She is oriented to person, place, and time. She appears well-developed and well-nourished. No distress.  HENT:  Head: Normocephalic and atraumatic.  Mouth/Throat: Oropharynx is clear and moist. No oropharyngeal exudate.  Eyes: Conjunctivae are normal. No scleral icterus.  Neck: Normal range of motion. Neck supple. No JVD present. No thyromegaly present.  Cardiovascular: Normal rate, regular  rhythm, normal heart sounds and intact distal pulses.  Exam reveals no gallop and no friction rub.   No murmur heard. Pulmonary/Chest: Effort normal and breath sounds normal. No respiratory distress. She has no wheezes. She has no rales. She exhibits no tenderness.  Musculoskeletal:       Left ankle: She exhibits swelling. She exhibits normal range of motion, no ecchymosis, no deformity, no laceration and normal pulse. No tenderness.  Lymphadenopathy:    She has no cervical adenopathy.  Neurological: She is alert and oriented to person, place, and time. No cranial nerve deficit. Coordination normal.  Skin: Skin is  warm and dry. She is not diaphoretic.  Mild warmth and redness of the left dorsal foot with some excoriation.  Wound from insect bite in between the second and third toe.     Psychiatric: She has a normal mood and affect. Her behavior is normal. Judgment and thought content normal.    ED Course  Procedures (including critical care time) DIAGNOSTIC STUDIES: Oxygen Saturation is 98% on room air, normal by my interpretation.    COORDINATION OF CARE: 11:46 PM-Discussed treatment plan which includes benadryl and antiinflammatory with pt at bedside and pt agreed to plan.   Labs Review Labs Reviewed - No data to display  Imaging Review No results found.   EKG Interpretation None      MDM   Final diagnoses:  Insect bite  Left foot pain   Patient is a 23 y.o. Female who presents to [facility] ED with swelling and pain of the left foot after bee sting.  Physical exam is consistent with local reaction to insect sting.  Patient was treated here with toradol injection for swelling and was given 50 mg of benadryl for allergic reaction and itching.  Suspect foot pain is likely due to swelling.  Foot is neurovascularly intact and patient is afebrile at this time.  Patient was prescribed naproxen for pain management at home and was given zyrtec for itching.  She was told to return for septic joint symptoms or worsening pain.  She is stable for discharge at this time.  She states understanding and agreement to the above plan.  Patient was given the resource list at this time.    I personally performed the services described in this documentation, which was scribed in my presence. The recorded information has been reviewed and is accurate.     Eben Burow, PA-C 04/17/14 [phone]

## 2014-04-17 MED ORDER — CETIRIZINE HCL 10 MG PO TABS: 10.0000 mg | ORAL_TABLET | Freq: Every day | ORAL | Status: DC

## 2014-04-17 MED ORDER — NAPROXEN 500 MG PO TABS: 500.0000 mg | ORAL_TABLET | Freq: Two times a day (BID) | ORAL | Status: DC

## 2014-04-17 NOTE — Discharge Instructions (Signed)
Insect Bite Mosquitoes, flies, fleas, bedbugs, and other insects can bite. Insect bites are different from insect stings. The bite may be red, puffy (swollen), and itchy for 2 to 4 days. Most bites get better on their own. HOME CARE   Do not scratch the bite.  Keep the bite clean and dry. Wash the bite with soap and water.  Put ice on the bite.  Put ice in a plastic bag.  Place a towel between your skin and the bag.  Leave the ice on for 20 minutes, 4 times a day. Do this for the first 2 to 3 days, or as told by your doctor.  You may use medicated lotions or creams to lessen itching as told by your doctor.  Only take medicines as told by your doctor.  If you are given medicines (antibiotics), take them as told. Finish them even if you start to feel better. You may need a tetanus shot if:  You cannot remember when you had your last tetanus shot.  You have never had a tetanus shot.  The injury broke your skin. If you need a tetanus shot and you choose not to have one, you may get tetanus. Sickness from tetanus can be serious. GET HELP RIGHT AWAY IF:   You have more pain, redness, or puffiness.  You see a red line on the skin coming from the bite.  You have a fever.  You have joint pain.  You have a headache or neck pain.  You feel weak.  You have a rash.  You have chest pain, or you are short of breath.  You have belly (abdominal) pain.  You feel sick to your stomach (nauseous) or throw up (vomit).  You feel very tired or sleepy. MAKE SURE YOU:   Understand these instructions.  Will watch your condition.  Will get help right away if you are not doing well or get worse. Document Released: 09/19/2000 Document Revised: 12/15/2011 Document Reviewed: 04/23/2011 [facility] Patient Information 2015 [place], [place]. This information is not intended to replace advice given to you by your health care provider. Make sure you discuss any questions you have with your health  care provider.   Emergency Department Resource Guide 1) Find a Doctor and Pay Out of Pocket Although you won't have to find out who is covered by your insurance plan, it is a good idea to ask around and get recommendations. You will then need to call the office and see if the doctor you have chosen will accept you as a new patient and what types of options they offer for patients who are self-pay. Some doctors offer discounts or will set up payment plans for their patients who do not have insurance, but you will need to ask so you aren't surprised when you get to your appointment.  2) Contact Your Local Health Department Not all health departments have doctors that can see patients for sick visits, but many do, so it is worth a call to see if yours does. If you don't know where your local health department is, you can check in your phone book. The CDC also has a tool to help you locate your state's health department, and many state websites also have listings of all of their local health departments.  3) Find a Walk-in Clinic If your illness is not likely to be very severe or complicated, you may want to try a walk in clinic. These are popping up all over the country in pharmacies,  drugstores, and shopping centers. They're usually staffed by nurse practitioners or physician assistants that have been trained to treat common illnesses and complaints. They're usually fairly quick and inexpensive. However, if you have serious medical issues or chronic medical problems, these are probably not your best option.  No Primary Care Doctor: - Call Health Connect at  [phone] - they can help you locate a primary care doctor that  accepts your insurance, provides certain services, etc. - Physician Referral Service- [phone]  Chronic Pain Problems: Organization         Address  Phone   Notes  [facility]  [phone] Patients need to be referred by their primary care doctor.    Medication Assistance: Organization         Address  Phone   Notes  [facility] Medication [facility] [address] [place] [place], [place] [address] [phone] --Must be a resident of [facility] -- Must have NO insurance coverage whatsoever (no Medicaid/ Medicare, etc.) -- The pt. MUST have a primary care doctor that directs their care regularly and follows them in the community   [facility]ist  [phone]   [employer]  [phone]    Agencies that provide inexpensive medical care: Organization         Address  Phone   Notes  [facility]  [phone]   [facility]    [phone]   [facility] [address] [place], [place] [address] [phone]   [facility] [address] [place] [phone]   Planned Parenthood    [phone]   [facility]    [phone]   [facility] and [facility]  [address], [place] Phone:  [phone], Fax:  [phone] Hours of Operation:  9 am - 6 pm, M-F.  Also accepts Medicaid/Medicare and self-pay.  [facility]  [address], [place] Phone: [phone], Fax: [phone]. Hours of Operation:  8:30 am - 5:30 pm, M-F.  Also accepts Medicaid and self-pay.  [facility] [place] [address], [place] Phone: [phone]   [facility] [address] Natasha Bence [place], [place] [phone], Ext. [phone] Mondays & Thursdays: 7-9 AM.  First 15 patients are seen on a first come, first serve basis.    Medicaid-accepting [facility] Providers:  Organization         Address  Phone   Notes  [facility] [address], [place] [phone] Also accepts self-pay patients.  [facility] [address] Laurell Josephs [place], [place]  [phone]   [facility] [address], [place] [phone]   [facility] [address] [place] [phone]   Renaye Rakers [address], [place]   [phone] Only accepts [place] Access [place] patients after they have their name applied to their card.   Self-Pay (no insurance) in [facility]:  Organization         Address  Phone   Notes  Sickle Cell Patients, [facility] [address] [place], [place] [phone]   [facility] [address] [place], [place] [phone]   [facility] [place]  [address], [place] (  [phone]   [facility]/Dr. Osei-Bonsu  [address] [place] or [address], [place] [phone] Phone number for both [place] and [place] locations is the same.  [facility] and [facility] [address], [place] [phone]   [facility] [address], [place] or [address] [phone]   [facility] [address] [place] [phone], phone; [phone], fax Sees patients 1st and 3rd Saturday of every month.  Must not qualify for public or private insurance (i.e. Medicaid, Medicare, [place] Health Choice, Veterans' Benefits)  Household income should be no more than 200% of the poverty level The clinic cannot treat you if you are pregnant or think you are pregnant  Sexually transmitted diseases are not treated at the clinic.    Dental Care: Organization         Address  Phone  Notes  [facility] [address] [place], [place] [phone] Accepts children up to age 72 who are enrolled in [place] or [place] Health Choice; pregnant women with a Medicaid card; and children who have applied for Medicaid or [place] Health Choice, but were declined, whose parents can pay a reduced fee at time of service.    [facility]  [address], [place] [phone] Accepts children up to age 37 who are enrolled in [place] or [place] Health Choice; pregnant women with a Medicaid card; and children who have applied for Medicaid or [place] Health Choice, but were declined, whose parents can pay a reduced fee at time of service.  [facility]  [address] [place], [place] [phone] Patients are seen by appointment only. Walk-ins are not accepted. [facility] will see patients 45 years of age and older. Monday - Tuesday (8am-5pm) Most Wednesdays (8:30-5pm) $30 per visit, cash only  [facility] Adult Dental Access [facility]  [address], [facility] [phone] Patients are seen by appointment only. Walk-ins are not accepted. [facility] will see patients 75 years of age and older. One Wednesday Evening (Monthly: Volunteer Based).  $30 per visit, cash only  [employer]  [phone] for adults; Children under age 20, call Graduate Pediatric Dentistry at [phone]. Children aged 82-14, please call [phone] to request a pediatric application.  Dental services are provided in all areas of dental care including fillings, crowns and bridges, complete and partial dentures, implants, gum treatment, root canals, and extractions. Preventive care is also provided. Treatment is provided to both adults and children. Patients are selected via a lottery and there is often a waiting list.   [facility] [address] [place]  [phone] [email or web address]   [facility] [address] [place], [place] [phone], Ext. [phone] Second and Fourth Thursday of each month, opens at 6:30 AM; Clinic ends at 9 AM.  Patients are seen on a first-come first-served basis, and a limited number are seen during each clinic.   [facility]  [address] [place], [place] [phone]   Eligibility Requirements You must have lived in [place], [place], or [place] counties for at least the last three months.   You cannot be eligible for state or federal sponsored [place], including [employer], [place], or [employer].   You generally cannot  be eligible for healthcare insurance through your employer.    How to apply: Eligibility screenings are held every Tuesday and Wednesday afternoon from 1:00 pm until 4:00 pm. You do not need an appointment for the interview!  [facility] [address] [place], [place] [phone]   [facility] Health Department  [phone]   [facility] Health Department  [phone]   [facility] Health Department  [phone]    Behavioral Health Resources in the Community: Intensive Outpatient Programs Organization         Address  Phone  Notes  [facility] [address], [place], [place] [phone]   [facility] Outpatient [address] [place], [place] [phone]   [facility] [address] [place], [place]  [phone]   [facility] Mental Health [address],  [place], [place] [phone] or [phone]   Substance Abuse Resources Organization         Address  Phone  Notes  [facility]  [phone]   [facility]  [phone]   [facility] [place]  [phone]   Floydene Flock  [phone]   Residential & Outpatient Substance Abuse Program  [phone]   Psychological Services Organization         Address  Phone  Notes  [facility] Behavioral Health  [phone]   [facility] Services  [phone]   [facility] Mental Health [address] [place] [phone] or [phone]    Mobile Crisis Teams Organization         Address  Phone  Notes  [facility], Mobile Crisis Care Unit  [phone]   [facility]  [address] [place], [place] [phone]   Doristine Locks [address] [place] [phone]    Self-Help/Support Groups Organization         Address  Phone             Notes  Mental Health Assoc. of  - variety of support groups  336- I7437963 Call for more information  Narcotics Anonymous (NA), [facility] [address], [employer] [place]  2 meetings at this location   Statistician         Address  Phone  Notes  [facility] [address],    [place]  [phone]   [facility]  [address] [place] [address], [place], [place] [phone]   [facility] [address] [place], [place] [phone] Admissions: 8am-3pm M-F  [facility] [address],    [place], [place] [phone]   [facility] [address] [place], [place], [place] [phone]   [facility] [address],  [place], [place] [phone]   [facility] - Intensive Outpatient [address] Laurell Josephs [address], [place], [place] [phone]   [facility] ([facility]iction [facility] [address] [place]  [place], [place] [phone] or [phone]   [facility] ([facility]) [address], [place], [place] [phone] Accepts Medicaid  [facility] [address]  [place] [phone] Substance Abuse/Addiction Treatment   [facility] Organization         Address  Phone  Notes  [facility]  [phone]   Angie Fava, PhD [address]ervyn Skeeters [place], [place]   [phone] or [phone]   [facility]   [address]  [place], [place] [phone]) [phone]   [facility] [address], [place], [place] [phone] Insurance/Medicaid/sponsorship through [employer]  [facility] [address]                                    [place], [place] [phone]  Therapy/tele-psych/case  [facility] [address]   [place], [place] [phone](336) [phone]    Dr. Lolly MustacheArfeen  [phone]) [phone]   [facility]  [employer] [facility] 1) [address], [place] 2) [address], [place] 3)  [address], [place] [phone](336) [phone]  [phone]) [phone]   [facility] Child Abuse Hotline [phone](336) [phone] or [phone](336) [phone] (After Hours)

## 2014-04-18 NOTE — ED Provider Notes (Signed)
Medical screening examination/treatment/procedure(s) were performed by non-physician practitioner and as supervising physician I was immediately available for consultation/collaboration.   EKG Interpretation None       Rhylan Gross R. Vic Esco, MD 04/18/14 0657 

## 2014-08-07 ENCOUNTER — Encounter (HOSPITAL_COMMUNITY): Payer: Self-pay | Admitting: Emergency Medicine

## 2014-08-11 ENCOUNTER — Encounter (HOSPITAL_COMMUNITY): Payer: Self-pay | Admitting: Emergency Medicine

## 2014-08-11 ENCOUNTER — Emergency Department (HOSPITAL_COMMUNITY)
Admission: EM | Admit: 2014-08-11 | Discharge: 2014-08-11 | Disposition: A | Discharge status: Home / Self Care | Payer: No Typology Code available for payment source | Attending: Emergency Medicine | Admitting: Emergency Medicine

## 2014-08-11 DIAGNOSIS — Z792 Long term (current) use of antibiotics: Secondary | ICD-10-CM | POA: Insufficient documentation

## 2014-08-11 DIAGNOSIS — Z791 Long term (current) use of non-steroidal anti-inflammatories (NSAID): Secondary | ICD-10-CM | POA: Insufficient documentation

## 2014-08-11 DIAGNOSIS — Z91040 Latex allergy status: Secondary | ICD-10-CM | POA: Insufficient documentation

## 2014-08-11 DIAGNOSIS — M549 Dorsalgia, unspecified: Secondary | ICD-10-CM

## 2014-08-11 DIAGNOSIS — M545 Low back pain: Secondary | ICD-10-CM | POA: Insufficient documentation

## 2014-08-11 DIAGNOSIS — Z79899 Other long term (current) drug therapy: Secondary | ICD-10-CM | POA: Insufficient documentation

## 2014-08-11 MED ORDER — CYCLOBENZAPRINE HCL 10 MG PO TABS: 10.0000 mg | ORAL_TABLET | Freq: Every day | ORAL | Status: DC

## 2014-08-11 MED ORDER — KETOROLAC TROMETHAMINE 30 MG/ML IJ SOLN
30.0000 mg | Freq: Once | INTRAMUSCULAR | Status: AC
  Administered 2014-08-11: 30 mg via INTRAMUSCULAR
  Filled 2014-08-11: qty 1

## 2014-08-11 MED ORDER — NAPROXEN 500 MG PO TABS: 500.0000 mg | ORAL_TABLET | Freq: Two times a day (BID) | ORAL | Status: DC

## 2014-08-11 MED ORDER — OXYCODONE-ACETAMINOPHEN 5-325 MG PO TABS
1.0000 | ORAL_TABLET | Freq: Once | ORAL | Status: AC
  Administered 2014-08-11: 1 via ORAL
  Filled 2014-08-11: qty 1

## 2014-08-11 MED ORDER — HYDROCODONE-ACETAMINOPHEN 5-325 MG PO TABS: 1.0000 | ORAL_TABLET | Freq: Four times a day (QID) | ORAL | Status: DC | PRN

## 2014-08-11 NOTE — ED Provider Notes (Signed)
CSN: [id number]     Arrival date & time 08/11/14  1228 History   First MD Initiated Contact with Patient 08/11/14 1248     Chief Complaint  Patient presents with  . Back Pain     (Consider location/radiation/quality/duration/timing/severity/associated sxs/prior Treatment) HPI Comments: The patient is a 23 year old female presenting to the emergency department chief complaint of low back pain for 3 days. Patient reports gradual onset of back discomfort worsened with movement, relieved with lying still. She reports she carried her 21-year-old son for several hours on Halloween prior to onset of discomfort. Patient denies treatment prior to arrival. Reports her husband was going to go to the store for Tylenol last night, but told him not to because  "I'm to the hospital tomorrow anyway". Reports taking a home pregnancy test yesterday, negative. Denies weakness or numbness.  NO PCP  Patient is a 23 y.o. female presenting with back pain. The history is provided by the patient. No language interpreter was used.  Back Pain Associated symptoms: no abdominal pain, no dysuria and no fever     Past Medical History  Diagnosis Date  . MVHQIONG(295.2Headache(784.0)    Past Surgical History  Procedure Laterality Date  . No past surgeries     No family history on file. History  Substance Use Topics  . Smoking status: Never Smoker   . Smokeless tobacco: Never Used  . Alcohol Use: No   OB History    Gravida Para Term Preterm AB TAB SAB Ectopic Multiple Living   1 1 1       1      Review of Systems  Constitutional: Negative for fever and chills.  Gastrointestinal: Negative for nausea, abdominal pain and diarrhea.  Genitourinary: Negative for dysuria, urgency, enuresis and difficulty urinating.  Musculoskeletal: Positive for myalgias and back pain. Negative for gait problem.      Allergies  Latex  Home Medications   Prior to Admission medications   Medication Sig Start Date End Date Taking?  Authorizing Provider  cetirizine (ZYRTEC ALLERGY) 10 MG tablet Take 1 tablet (10 mg total) by mouth at bedtime. 04/17/14   Courtney A Forcucci, PA-C  naproxen (NAPROSYN) 500 MG tablet Take 1 tablet (500 mg total) by mouth 2 (two) times daily. 04/17/14   Courtney A Forcucci, PA-C  phenazopyridine (PYRIDIUM) 200 MG tablet Take 1 tablet (200 mg total) by mouth 3 (three) times daily with meals. 02/27/14   Arman FilterGail K Schulz, NP  sulfamethoxazole-trimethoprim (BACTRIM DS) 800-160 MG per tablet Take 1 tablet by mouth 2 (two) times daily. 02/27/14   Arman FilterGail K Schulz, NP   BP 90/66 mmHg  Pulse 102  Temp(Src) 98.3 F (36.8 C) (Oral)  Resp 16  SpO2 99% Physical Exam  Constitutional: She is oriented to person, place, and time. She appears well-developed and well-nourished. No distress.  HENT:  Head: Normocephalic and atraumatic.  Neck: Neck supple.  Pulmonary/Chest: Effort normal. No respiratory distress.  Abdominal: Soft. There is no tenderness. There is no rigidity and no guarding.  Musculoskeletal:       Back:  Bilateral paravertebral discomfort with palpation. Positive straight leg raise. Normal sensation and strength to bilateral lower extremities.  Neurological: She is alert and oriented to person, place, and time.  Skin: Skin is warm and dry. She is not diaphoretic.  Psychiatric: She has a normal mood and affect. Her behavior is normal.  Nursing note and vitals reviewed.   ED Course  Procedures (including critical care time) Labs Review Labs  Reviewed - No data to display  Imaging Review No results found.   EKG Interpretation None      MDM   Final diagnoses:  Bilateral back pain, unspecified location   Patient with back pain.  No neurological deficits and normal neuro exam.  Patient can walk but states is painful.  No loss of bowel or bladder control.  No concern for cauda equina.  No fever, night sweats, weight loss, h/o cancer, IVDU.  RICE protocol and pain medicine indicated and  discussed with patient.  Meds given in ED:  Medications  oxyCODONE-acetaminophen (PERCOCET/ROXICET) 5-325 MG per tablet 1 tablet (1 tablet Oral Given 08/11/14 1310)  ketorolac (TORADOL) 30 MG/ML injection 30 mg (30 mg Intramuscular Given 08/11/14 1310)    Discharge Medication List as of 08/11/2014  1:06 PM    START taking these medications   Details  cyclobenzaprine (FLEXERIL) 10 MG tablet Take 1 tablet (10 mg total) by mouth at bedtime., Starting 08/11/2014, Until Discontinued, Print    HYDROcodone-acetaminophen (NORCO/VICODIN) 5-325 MG per tablet Take 1 tablet by mouth every 6 (six) hours as needed for moderate pain or severe pain., Starting 08/11/2014, Until Discontinued, Print          Mellody DrownLauren Talullah Abate, PA-C 08/11/14 1549  Enid SkeensJoshua M Zavitz, MD 08/11/14 [phone]

## 2014-08-11 NOTE — ED Notes (Signed)
Back pain x 3 days cannot lift her child denies injury

## 2014-08-11 NOTE — Discharge Instructions (Signed)
Call a back specialist for further evaluation of your back pain. Call for a follow up appointment with a Family or Primary Care Provider.  Return if Symptoms worsen.   Take medication as prescribed.  Do not operate heavy machinery or drink alcohol while taking narcotic pain medication or muscle relaxant.  Alternate Ice and heat to reduce symptoms.

## 2015-05-06 ENCOUNTER — Emergency Department (HOSPITAL_COMMUNITY)
Admission: EM | Admit: 2015-05-06 | Discharge: 2015-05-06 | Disposition: A | Discharge status: Home / Self Care | Payer: Medicaid Other | Attending: Emergency Medicine | Admitting: Emergency Medicine

## 2015-05-06 ENCOUNTER — Emergency Department (HOSPITAL_COMMUNITY): Payer: Medicaid Other

## 2015-05-06 ENCOUNTER — Encounter (HOSPITAL_COMMUNITY): Payer: Self-pay

## 2015-05-06 DIAGNOSIS — L03115 Cellulitis of right lower limb: Secondary | ICD-10-CM

## 2015-05-06 DIAGNOSIS — Z91040 Latex allergy status: Secondary | ICD-10-CM | POA: Insufficient documentation

## 2015-05-06 LAB — CBC WITH DIFFERENTIAL/PLATELET
BASOS ABS: 0 10*3/uL (ref 0.0–0.1)
BASOS PCT: 0 % (ref 0–1)
EOS ABS: 0.2 10*3/uL (ref 0.0–0.7)
Eosinophils Relative: 2 % (ref 0–5)
HCT: 41.9 % (ref 36.0–46.0)
HEMOGLOBIN: 14.6 g/dL (ref 12.0–15.0)
Lymphocytes Relative: 17 % (ref 12–46)
Lymphs Abs: 1.4 10*3/uL (ref 0.7–4.0)
MCH: 32 pg (ref 26.0–34.0)
MCHC: 34.8 g/dL (ref 30.0–36.0)
MCV: 91.9 fL (ref 78.0–100.0)
MONOS PCT: 9 % (ref 3–12)
Monocytes Absolute: 0.7 10*3/uL (ref 0.1–1.0)
Neutro Abs: 5.8 10*3/uL (ref 1.7–7.7)
Neutrophils Relative %: 72 % (ref 43–77)
PLATELETS: 156 10*3/uL (ref 150–400)
RBC: 4.56 MIL/uL (ref 3.87–5.11)
RDW: 12.2 % (ref 11.5–15.5)
WBC: 8.1 10*3/uL (ref 4.0–10.5)

## 2015-05-06 LAB — COMPREHENSIVE METABOLIC PANEL
ALK PHOS: 41 U/L (ref 38–126)
ALT: 19 U/L (ref 14–54)
AST: 27 U/L (ref 15–41)
Albumin: 4.6 g/dL (ref 3.5–5.0)
Anion gap: 10 (ref 5–15)
BUN: 10 mg/dL (ref 6–20)
CO2: 23 mmol/L (ref 22–32)
Calcium: 9 mg/dL (ref 8.9–10.3)
Chloride: 106 mmol/L (ref 101–111)
Creatinine, Ser: 0.69 mg/dL (ref 0.44–1.00)
GFR calc Af Amer: >60 mL/min (ref >60)
GLUCOSE: 92 mg/dL (ref 65–99)
POTASSIUM: 3.3 mmol/L — AB (ref 3.5–5.1)
SODIUM: 139 mmol/L (ref 135–145)
Total Bilirubin: 0.8 mg/dL (ref 0.3–1.2)
Total Protein: 7.4 g/dL (ref 6.5–8.1)

## 2015-05-06 MED ORDER — ONDANSETRON HCL 4 MG/2ML IJ SOLN
4.0000 mg | Freq: Once | INTRAMUSCULAR | Status: DC
  Filled 2015-05-06: qty 2

## 2015-05-06 MED ORDER — SULFAMETHOXAZOLE-TRIMETHOPRIM 800-160 MG PO TABS
1.0000 | ORAL_TABLET | Freq: Once | ORAL | Status: AC
  Administered 2015-05-06: 1 via ORAL
  Filled 2015-05-06: qty 1

## 2015-05-06 MED ORDER — SODIUM CHLORIDE 0.9 % IV BOLUS (SEPSIS)
1000.0000 mL | Freq: Once | INTRAVENOUS | Status: AC
  Administered 2015-05-06: 1000 mL via INTRAVENOUS

## 2015-05-06 MED ORDER — CEPHALEXIN 500 MG PO CAPS: 500.0000 mg | ORAL_CAPSULE | Freq: Four times a day (QID) | ORAL | Status: DC

## 2015-05-06 MED ORDER — SULFAMETHOXAZOLE-TRIMETHOPRIM 800-160 MG PO TABS: 1.0000 | ORAL_TABLET | Freq: Two times a day (BID) | ORAL | Status: AC

## 2015-05-06 MED ORDER — ONDANSETRON HCL 4 MG/2ML IJ SOLN
4.0000 mg | Freq: Once | INTRAMUSCULAR | Status: AC
  Administered 2015-05-06: 4 mg via INTRAVENOUS
  Filled 2015-05-06: qty 2

## 2015-05-06 MED ORDER — CEPHALEXIN 250 MG PO CAPS
500.0000 mg | ORAL_CAPSULE | Freq: Once | ORAL | Status: AC
  Administered 2015-05-06: 500 mg via ORAL
  Filled 2015-05-06: qty 2

## 2015-05-06 MED ORDER — MORPHINE SULFATE 4 MG/ML IJ SOLN
4.0000 mg | Freq: Once | INTRAMUSCULAR | Status: AC
Start: 2015-05-06 — End: 2015-05-06
  Administered 2015-05-06: 4 mg via INTRAVENOUS
  Filled 2015-05-06: qty 1

## 2015-05-06 MED ORDER — ONDANSETRON HCL 4 MG PO TABS: 4.0000 mg | ORAL_TABLET | Freq: Four times a day (QID) | ORAL | Status: DC

## 2015-05-06 NOTE — Discharge Instructions (Signed)

## 2015-05-06 NOTE — ED Notes (Addendum)
Pt reporting right foot/toe pain. Two days ago she thinks she was bitten by something on her right third toe, very itchy. Yesterday she noticed the area became very red and she felt very dizzy and lightheaded. Today, the redness has worsened and right foot appears red and swollen with red streak from her toe toward her right ankle. Circular lesions noted to multiple toes. Denies N/V/D.

## 2015-05-06 NOTE — ED Provider Notes (Signed)
CSN: [id number]     Arrival date & time 05/06/15  1920 History   First MD Initiated Contact with Patient 05/06/15 2102     Chief Complaint  Patient presents with  . Toe Pain     (Consider location/radiation/quality/duration/timing/severity/associated sxs/prior Treatment) HPI   Patient is a 24 year old female, otherwise healthy, who presents emergency department today with 2 days right foot and toe pain.  She suspects it may began with some mosquito bites on the dorsum of her right foot and toe #3 and 4, which is progressive over the past 2 days to redness and swelling with pain and purulent discharge from a few breaks in the skin which she believes she scratched in her sleep. Yesterday she did not feel well, and reports subjective fevers, but did feel better today except for increasing pain with ambulation. She states her toes are so swollen she cannot move them. She has tried to soak her feet, without improvement.  Past Medical History  Diagnosis Date  . WGNFAOZH(086.5)    Past Surgical History  Procedure Laterality Date  . No past surgeries     No family history on file. History  Substance Use Topics  . Smoking status: Never Smoker   . Smokeless tobacco: Never Used  . Alcohol Use: No   OB History    Gravida Para Term Preterm AB TAB SAB Ectopic Multiple Living   Review of Systems  10 Systems reviewed and are negative for acute change except as noted in the HPI.     Allergies  Latex and Bee venom  Home Medications   Prior to Admission medications   Medication Sig Start Date End Date Taking? Authorizing Provider  cetirizine (ZYRTEC ALLERGY) 10 MG tablet Take 1 tablet (10 mg total) by mouth at bedtime. Patient not taking: Reported on 08/11/2014 04/17/14   Toni Amend Forcucci, PA-C  cyclobenzaprine (FLEXERIL) 10 MG tablet Take 1 tablet (10 mg total) by mouth at bedtime. Patient not taking: Reported on 05/06/2015 08/11/14   Mellody Drown, PA-C   HYDROcodone-acetaminophen (NORCO/VICODIN) 5-325 MG per tablet Take 1 tablet by mouth every 6 (six) hours as needed for moderate pain or severe pain. Patient not taking: Reported on 05/06/2015 08/11/14   Mellody Drown, PA-C  naproxen (NAPROSYN) 500 MG tablet Take 1 tablet (500 mg total) by mouth 2 (two) times daily. Patient not taking: Reported on 05/06/2015 08/11/14   Mellody Drown, PA-C  phenazopyridine (PYRIDIUM) 200 MG tablet Take 1 tablet (200 mg total) by mouth 3 (three) times daily with meals. Patient not taking: Reported on 08/11/2014 02/27/14   Earley Favor, NP  sulfamethoxazole-trimethoprim (BACTRIM DS) 800-160 MG per tablet Take 1 tablet by mouth 2 (two) times daily. Patient not taking: Reported on 08/11/2014 02/27/14   Earley Favor, NP   BP 120/54 mmHg  Pulse 62  Temp(Src) 98.4 F (36.9 C) (Oral)  Resp 16  Ht  (1.575 m)  Wt 125 lb (56.7 kg)  BMI 22.86 kg/m2  SpO2 100%  LMP 03/05/2015 (Approximate) Physical Exam  Constitutional: She is oriented to person, place, and time. She appears well-developed and well-nourished. No distress.  HENT:  Head: Normocephalic and atraumatic.  Right Ear: External ear normal.  Left Ear: External ear normal.  Nose: Nose normal.  Mouth/Throat: Oropharynx is clear and moist. No oropharyngeal exudate.  Eyes: Conjunctivae and EOM are normal. Pupils are equal, round, and reactive to light. Right eye exhibits no  discharge. Left eye exhibits no discharge. No scleral icterus.  Neck: Normal range of motion. Neck supple. No JVD present. No tracheal deviation present.  Cardiovascular: Normal rate and regular rhythm.  Exam reveals no gallop and no friction rub.   No murmur heard. Pulmonary/Chest: Effort normal and breath sounds normal. No stridor. No respiratory distress. She has no wheezes. She has no rales. She exhibits no tenderness.  Musculoskeletal: Normal range of motion.  Lymphadenopathy:    She has no cervical adenopathy.  Neurological: She is alert  and oriented to person, place, and time. She exhibits normal muscle tone. Coordination normal.  Skin: Skin is warm and dry. No rash noted. She is not diaphoretic. There is erythema. No pallor.  Erythema and induration to the dorsum of the right foot extending from toe #3 and 4 of the foot approximately 10 cm, with one faint erythematous streak farther up the foot another 5 cm Toe # 3 excoriation/scab approximately 5mm in diameter Toe #4 excoriation/scab approx 3mm in diameter  Psychiatric: She has a normal mood and affect. Her behavior is normal. Judgment and thought content normal.      ED Course  Procedures (including critical care time) Labs Review Labs Reviewed  COMPREHENSIVE METABOLIC PANEL - Abnormal; Notable for the following:    Potassium 3.3 (*)    All other components within normal limits  CBC WITH DIFFERENTIAL/PLATELET    Imaging Review Dg Foot Complete Right  05/06/2015   CLINICAL DATA:  Right foot pain. May have been begin by something on the third toe, and now it is itchy and erythematous.  EXAM: RIGHT FOOT COMPLETE - 3+ VIEW  COMPARISON:  None.  FINDINGS: There is no evidence of fracture or dislocation. There is no evidence of arthropathy or other focal bone abnormality. Soft tissues are unremarkable.  IMPRESSION: Negative.   Electronically Signed   By: Ellery Plunk M.D.   On: 05/06/2015 22:14     EKG Interpretation None      MDM   Final diagnoses:  None    Patient with possible mosquito bites and cellulitis to the dorsum of the right foot and toe #3 and 4 Patient is otherwise healthy without any other risk factors She is afebrile, not tachycardic, no nausea no vomiting, does not appear ill or toxic Patient had no leukocytosis, x-ray of the foot was negative for any free air pathology or osteomyelitis  The patient will be discharged with antibiotic, return precautions were explained and pt verbalized understanding.   Pt was found to have mild hypokalemia,  replaced while here in the Ed.  First dose of abx given, as well as pain meds and work note.        Danelle Berry, PA-C 05/17/15 0416  Laurence Spates, MD 05/17/15 [phone]

## 2015-08-22 ENCOUNTER — Emergency Department (HOSPITAL_COMMUNITY)
Admission: EM | Admit: 2015-08-22 | Discharge: 2015-08-22 | Discharge status: Against Medical Advice | Payer: Medicaid Other | Attending: Emergency Medicine | Admitting: Emergency Medicine

## 2015-08-22 ENCOUNTER — Encounter (HOSPITAL_COMMUNITY): Payer: Self-pay | Admitting: *Deleted

## 2015-08-22 DIAGNOSIS — S0991XA Unspecified injury of ear, initial encounter: Secondary | ICD-10-CM | POA: Insufficient documentation

## 2015-08-22 DIAGNOSIS — Y998 Other external cause status: Secondary | ICD-10-CM | POA: Insufficient documentation

## 2015-08-22 DIAGNOSIS — S0990XA Unspecified injury of head, initial encounter: Secondary | ICD-10-CM | POA: Insufficient documentation

## 2015-08-22 DIAGNOSIS — Y9289 Other specified places as the place of occurrence of the external cause: Secondary | ICD-10-CM | POA: Insufficient documentation

## 2015-08-22 DIAGNOSIS — Y9389 Activity, other specified: Secondary | ICD-10-CM | POA: Insufficient documentation

## 2015-08-22 NOTE — ED Notes (Signed)
Pt spoke with tech first and stated she was leaving and coming back tomorrow because her baby was crying and she needed to take her child home.
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
## 2015-11-13 ENCOUNTER — Encounter (HOSPITAL_COMMUNITY): Payer: Self-pay

## 2015-11-13 ENCOUNTER — Emergency Department (HOSPITAL_COMMUNITY)
Admission: EM | Admit: 2015-11-13 | Discharge: 2015-11-13 | Disposition: A | Discharge status: Home / Self Care | Payer: No Typology Code available for payment source | Attending: Emergency Medicine | Admitting: Emergency Medicine

## 2015-11-13 DIAGNOSIS — R509 Fever, unspecified: Secondary | ICD-10-CM | POA: Insufficient documentation

## 2015-11-13 DIAGNOSIS — R1110 Vomiting, unspecified: Secondary | ICD-10-CM | POA: Insufficient documentation

## 2015-11-13 DIAGNOSIS — R51 Headache: Secondary | ICD-10-CM | POA: Insufficient documentation

## 2015-11-13 DIAGNOSIS — R531 Weakness: Secondary | ICD-10-CM | POA: Insufficient documentation

## 2015-11-13 LAB — COMPREHENSIVE METABOLIC PANEL
ALT: 121 U/L — ABNORMAL HIGH (ref 14–54)
AST: 104 U/L — ABNORMAL HIGH (ref 15–41)
Albumin: 3.8 g/dL (ref 3.5–5.0)
Alkaline Phosphatase: 55 U/L (ref 38–126)
Anion gap: 13 (ref 5–15)
BUN: 9 mg/dL (ref 6–20)
CHLORIDE: 106 mmol/L (ref 101–111)
CO2: 22 mmol/L (ref 22–32)
Calcium: 8.9 mg/dL (ref 8.9–10.3)
Creatinine, Ser: 0.75 mg/dL (ref 0.44–1.00)
Glucose, Bld: 103 mg/dL — ABNORMAL HIGH (ref 65–99)
POTASSIUM: 3.6 mmol/L (ref 3.5–5.1)
SODIUM: 141 mmol/L (ref 135–145)
Total Bilirubin: 0.7 mg/dL (ref 0.3–1.2)
Total Protein: 7.1 g/dL (ref 6.5–8.1)

## 2015-11-13 LAB — CBC
HEMATOCRIT: 42.2 % (ref 36.0–46.0)
HEMOGLOBIN: 14.6 g/dL (ref 12.0–15.0)
MCH: 31.9 pg (ref 26.0–34.0)
MCHC: 34.6 g/dL (ref 30.0–36.0)
MCV: 92.3 fL (ref 78.0–100.0)
Platelets: 103 10*3/uL — ABNORMAL LOW (ref 150–400)
RBC: 4.57 MIL/uL (ref 3.87–5.11)
RDW: 12.3 % (ref 11.5–15.5)
WBC: 5.4 10*3/uL (ref 4.0–10.5)

## 2015-11-13 LAB — URINALYSIS, ROUTINE W REFLEX MICROSCOPIC
Glucose, UA: NEGATIVE mg/dL
HGB URINE DIPSTICK: NEGATIVE
Ketones, ur: 15 mg/dL — AB
LEUKOCYTES UA: NEGATIVE
Nitrite: NEGATIVE
PH: 5.5 (ref 5.0–8.0)
PROTEIN: 100 mg/dL — AB
SPECIFIC GRAVITY, URINE: 1.042 — AB (ref 1.005–1.030)

## 2015-11-13 LAB — URINE MICROSCOPIC-ADD ON

## 2015-11-13 LAB — I-STAT BETA HCG BLOOD, ED (MC, WL, AP ONLY): I-stat hCG, quantitative: <5 m[IU]/mL (ref <5)

## 2015-11-13 LAB — LIPASE, BLOOD: LIPASE: 28 U/L (ref 11–51)
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
## 2015-11-13 NOTE — ED Notes (Signed)
Patient is sitting in wheelchair in ED waiting area. NAD

## 2015-11-13 NOTE — ED Notes (Signed)
Pt reports being "sick" for two days and describes symptoms such as headache, weakness and body aches, subjective fever and emesis x 3 today.

## 2015-11-13 NOTE — ED Notes (Signed)
Patient family member approaching desk multiple times to inquire about wait time. Family member informed of longest wait and current ED disposition

## 2015-11-13 NOTE — ED Notes (Signed)
Pt family member came up to desk asking about the wait time and how much longer would it be before pt is taken back to a room. This NT explained the current wait time and how many pt were currently in our ED.
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
## 2016-11-12 DIAGNOSIS — Z79899 Other long term (current) drug therapy: Secondary | ICD-10-CM | POA: Diagnosis not present

## 2016-11-12 DIAGNOSIS — R51 Headache: Secondary | ICD-10-CM | POA: Insufficient documentation

## 2016-11-12 DIAGNOSIS — Y999 Unspecified external cause status: Secondary | ICD-10-CM | POA: Diagnosis not present

## 2016-11-12 DIAGNOSIS — Z91040 Latex allergy status: Secondary | ICD-10-CM | POA: Insufficient documentation

## 2016-11-12 DIAGNOSIS — Y939 Activity, unspecified: Secondary | ICD-10-CM | POA: Diagnosis not present

## 2016-11-12 DIAGNOSIS — M542 Cervicalgia: Secondary | ICD-10-CM | POA: Insufficient documentation

## 2016-11-12 DIAGNOSIS — Y929 Unspecified place or not applicable: Secondary | ICD-10-CM | POA: Insufficient documentation

## 2016-11-12 DIAGNOSIS — M25512 Pain in left shoulder: Secondary | ICD-10-CM | POA: Diagnosis not present

## 2016-11-13 ENCOUNTER — Emergency Department (HOSPITAL_COMMUNITY): Payer: Medicaid Other

## 2016-11-13 ENCOUNTER — Emergency Department (HOSPITAL_COMMUNITY)
Admission: EM | Admit: 2016-11-13 | Discharge: 2016-11-13 | Disposition: A | Discharge status: Home / Self Care | Payer: Medicaid Other | Attending: Emergency Medicine | Admitting: Emergency Medicine

## 2016-11-13 LAB — POC URINE PREG, ED: Preg Test, Ur: NEGATIVE
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
## 2016-11-13 NOTE — Discharge Instructions (Signed)
Please take tylenol/motrin for pain. You may wear the cervical collar for comfort, however this is not necessary and you should try to use it as minimally as possible. Please return to the ED if you have numbness, weakness, persistent vomiting, or any other concerning symptoms.

## 2017-01-05 ENCOUNTER — Emergency Department (HOSPITAL_COMMUNITY)
Admission: EM | Admit: 2017-01-05 | Discharge: 2017-01-05 | Disposition: A | Discharge status: Home / Self Care | Payer: Medicaid Other | Attending: Emergency Medicine | Admitting: Emergency Medicine

## 2017-01-05 ENCOUNTER — Encounter (HOSPITAL_COMMUNITY): Payer: Self-pay

## 2017-01-05 DIAGNOSIS — K0889 Other specified disorders of teeth and supporting structures: Secondary | ICD-10-CM

## 2017-01-05 DIAGNOSIS — Z91040 Latex allergy status: Secondary | ICD-10-CM | POA: Insufficient documentation

## 2017-01-05 DIAGNOSIS — Z79899 Other long term (current) drug therapy: Secondary | ICD-10-CM | POA: Insufficient documentation

## 2017-01-05 DIAGNOSIS — K047 Periapical abscess without sinus: Secondary | ICD-10-CM | POA: Insufficient documentation

## 2017-01-05 MED ORDER — NAPROXEN 500 MG PO TABS: 500.0000 mg | ORAL_TABLET | Freq: Three times a day (TID) | ORAL | 0 refills | Status: DC | PRN

## 2017-01-05 MED ORDER — PENICILLIN V POTASSIUM 500 MG PO TABS: 500.0000 mg | ORAL_TABLET | Freq: Four times a day (QID) | ORAL | 0 refills | Status: AC

## 2017-01-05 NOTE — ED Notes (Signed)
Papers reviewed along with medications and dental follow up options. Patient verbalizes understanding

## 2017-01-05 NOTE — ED Provider Notes (Signed)
[facility]PT Provider Note    By signing my name below, I, Earmon Phoenix, attest that this documentation has been prepared under the direction and in the presence of Lavera Guise, MD. Electronically Signed: Earmon Phoenix, ED Scribe. 01/05/17. 6:53 PM.   History   Chief Complaint Chief Complaint  Patient presents with  . Dental Pain   The history is provided by the patient and medical records. No language interpreter was used.    Sydney Allison is a 26 y.o. female who presents to the Emergency Department complaining of lower left sided dental pain that has been ongoing intermittently for the past year. She reports this episode began about three weeks ago. She reports associated facial swelling and left ear pain. She has been icing the area with no significant relief. She has been taking Tylenol PM with no relief. Brushing her teeth or eating increase her pain. She denies alleviating factors. She denies fever, chills, drainage, difficulty swallowing or breathing, trismus..   Past Medical History:  Diagnosis Date  . ZOXWRUEA(540.9)     Patient Active Problem List   Diagnosis Date Noted  . Mood disorder (HCC) 04/17/2013  . PTSD (post-traumatic stress disorder) 04/17/2013    Past Surgical History:  Procedure Laterality Date  . NO PAST SURGERIES      OB History    Gravida Para Term Preterm AB Living   SAB TAB Ectopic Multiple Live Births           1       Home Medications    Prior to Admission medications   Medication Sig Start Date End Date Taking? Authorizing Provider  acetaminophen (TYLENOL) 325 MG tablet Take 650 mg by mouth every 6 (six) hours as needed for mild pain.    Historical Provider, MD  cephALEXin (KEFLEX) 500 MG capsule Take 1 capsule (500 mg total) by mouth 4 (four) times daily. Patient not taking: Reported on 11/13/2016 05/06/15   Danelle Berry, PA-C  cetirizine (ZYRTEC ALLERGY) 10 MG tablet Take 1 tablet (10 mg total) by  mouth at bedtime. Patient not taking: Reported on 08/11/2014 04/17/14   Toni Amend Forcucci, PA-C  cyclobenzaprine (FLEXERIL) 10 MG tablet Take 1 tablet (10 mg total) by mouth at bedtime. Patient not taking: Reported on 05/06/2015 08/11/14   Mellody Drown, PA-C  HYDROcodone-acetaminophen (NORCO/VICODIN) 5-325 MG per tablet Take 1 tablet by mouth every 6 (six) hours as needed for moderate pain or severe pain. Patient not taking: Reported on 05/06/2015 08/11/14   Mellody Drown, PA-C  naproxen (NAPROSYN) 500 MG tablet Take 1 tablet (500 mg total) by mouth 2 (two) times daily. Patient not taking: Reported on 05/06/2015 08/11/14   Mellody Drown, PA-C  naproxen (NAPROSYN) 500 MG tablet Take 1 tablet (500 mg total) by mouth 3 (three) times daily with meals as needed for mild pain or moderate pain. 01/05/17   Lavera Guise, MD  ondansetron (ZOFRAN) 4 MG tablet Take 1 tablet (4 mg total) by mouth every 6 (six) hours. Patient not taking: Reported on 11/13/2016 05/06/15   Danelle Berry, PA-C  penicillin v potassium (VEETID) 500 MG tablet Take 1 tablet (500 mg total) by mouth 4 (four) times daily. 01/05/17 01/12/17  Lavera Guise, MD  phenazopyridine (PYRIDIUM) 200 MG tablet Take 1 tablet (200 mg total) by mouth 3 (three) times daily with meals. Patient not taking: Reported on 08/11/2014 02/27/14   Earley Favor, NP    Family  History History reviewed. No pertinent family history.  Social History Social History  Substance Use Topics  . Smoking status: Never Smoker  . Smokeless tobacco: Never Used  . Alcohol use No     Allergies   Latex and Bee venom   Review of Systems Review of Systems  Constitutional: Negative for chills and fever.  HENT: Positive for dental problem and ear pain. Negative for trouble swallowing.   Respiratory: Negative for shortness of breath.       Physical Exam Updated Vital Signs BP 120/79 (BP Location: Left Arm)   Pulse 79   Temp 98.5 F (36.9 C) (Oral)   Resp 16   Ht  (1.575 m)    Wt 134 lb (60.8 kg)   LMP 12/10/2016 (Within Days)   SpO2 100%   BMI 24.51 kg/m   Physical Exam Physical Exam  Nursing note and vitals reviewed. Constitutional: Well developed, well nourished, non-toxic, and in no acute distress Head: Normocephalic and atraumatic.  Mouth/Throat: Oropharynx is clear and moist. Gingival swelling without appreciable abscess involving the left mandibular premolar. Incoming left mandibular wisdom teeth. Mild soft tissue swelling of the left mandible Neck: Normal range of motion. Neck supple.  Cardiovascular: Normal rate and regular rhythm.   Pulmonary/Chest: Effort normal and breath sounds normal.  Abdominal: Soft. There is no tenderness. There is no rebound and no guarding.  Musculoskeletal: Normal range of motion.  Neurological: Alert, no facial droop, fluent speech, moves all extremities symmetrically Skin: Skin is warm and dry.  Psychiatric: Cooperative   ED Treatments / Results  DIAGNOSTIC STUDIES: Oxygen Saturation is 100% on RA, normal by my interpretation.   COORDINATION OF CARE: 6:50 PM- Will prescribe antibiotic and NSAID. Will give dental resources. Pt verbalizes understanding and agrees to plan.  Medications - No data to display  Labs (all labs ordered are listed, but only abnormal results are displayed) Labs Reviewed - No data to display  EKG  EKG Interpretation None       Radiology No results found.  Procedures Procedures (including critical care time)  Medications Ordered in ED Medications - No data to display   Initial Impression / Assessment and Plan / ED Course  I have reviewed the triage vital signs and the nursing notes.  Pertinent labs & imaging results that were available during my care of the patient were reviewed by me and considered in my medical decision making (see chart for details).     Presenting with intermittent facial swelling and left-sided dental pain. Presentation concerning for potential.  Periapical abscess, but there is no appreciable area that could be drained at bedside. She is nontoxic in no acute distress with normal vital signs. Mild soft tissue swelling around the left mandible, but no neck swelling, normal range of motion, handling secretions, normal work of breathing. Do not suspect deep space soft tissue neck infection. We will start on penicillin and anti-inflammatory medications. Dental resources provided. Strict return and follow-up instructions reviewed. She expressed understanding of all discharge instructions and felt comfortable with the plan of care.   Final Clinical Impressions(s) / ED Diagnoses   Final diagnoses:  Pain, dental  Periapical abscess    New Prescriptions New Prescriptions   NAPROXEN (NAPROSYN) 500 MG TABLET    Take 1 tablet (500 mg total) by mouth 3 (three) times daily with meals as needed for mild pain or moderate pain.   PENICILLIN V POTASSIUM (VEETID) 500 MG TABLET    Take 1 tablet (500 mg  total) by mouth 4 (four) times daily.    I personally performed the services described in this documentation, which was scribed in my presence. The recorded information has been reviewed and is accurate.     Lavera Guise, MD 01/05/17 [phone]

## 2017-01-05 NOTE — Discharge Instructions (Signed)
Please arrange follow-up with dentist for further management. Take medications as prescribed. Return without fail for worsening symptoms, including worsening swelling, fevers despite antibiotics, or any other symptoms concerning to

## 2017-01-05 NOTE — ED Triage Notes (Signed)
Pt states she has had her wisdom teeth coming in and she has pain in the jaw. She reports pain is severe and radiating. Pt also reports swelling in the cheek.

## 2017-03-16 ENCOUNTER — Encounter: Payer: Self-pay | Admitting: Adult Health

## 2017-03-23 ENCOUNTER — Encounter: Payer: Self-pay | Admitting: *Deleted

## 2017-03-23 ENCOUNTER — Ambulatory Visit (INDEPENDENT_AMBULATORY_CARE_PROVIDER_SITE_OTHER): Payer: Self-pay | Admitting: *Deleted

## 2017-03-23 DIAGNOSIS — Z3201 Encounter for pregnancy test, result positive: Secondary | ICD-10-CM

## 2017-03-23 DIAGNOSIS — Z3200 Encounter for pregnancy test, result unknown: Secondary | ICD-10-CM

## 2017-03-23 LAB — POCT PREGNANCY, URINE: Preg Test, Ur: POSITIVE — AB

## 2017-03-23 NOTE — Progress Notes (Signed)
Here for urine pregnancy test which was positive. States plan [facility]. States periods irregular- thinks last one was in April. Reviewed meds with her, not taking any except tylenol.Given proof of pregnancy letter.

## 2017-04-07 ENCOUNTER — Encounter (HOSPITAL_COMMUNITY): Payer: Self-pay

## 2017-04-07 ENCOUNTER — Emergency Department (HOSPITAL_COMMUNITY): Payer: Medicaid Other

## 2017-04-07 DIAGNOSIS — S62502A Fracture of unspecified phalanx of left thumb, initial encounter for closed fracture: Secondary | ICD-10-CM | POA: Insufficient documentation

## 2017-04-07 DIAGNOSIS — Z3A11 11 weeks gestation of pregnancy: Secondary | ICD-10-CM | POA: Diagnosis not present

## 2017-04-07 DIAGNOSIS — R1031 Right lower quadrant pain: Secondary | ICD-10-CM | POA: Insufficient documentation

## 2017-04-07 DIAGNOSIS — O26891 Other specified pregnancy related conditions, first trimester: Secondary | ICD-10-CM | POA: Insufficient documentation

## 2017-04-07 DIAGNOSIS — Y929 Unspecified place or not applicable: Secondary | ICD-10-CM | POA: Diagnosis not present

## 2017-04-07 DIAGNOSIS — Y939 Activity, unspecified: Secondary | ICD-10-CM | POA: Insufficient documentation

## 2017-04-07 DIAGNOSIS — S6992XA Unspecified injury of left wrist, hand and finger(s), initial encounter: Secondary | ICD-10-CM | POA: Diagnosis present

## 2017-04-07 DIAGNOSIS — K0889 Other specified disorders of teeth and supporting structures: Secondary | ICD-10-CM | POA: Insufficient documentation

## 2017-04-07 DIAGNOSIS — Y999 Unspecified external cause status: Secondary | ICD-10-CM | POA: Diagnosis not present

## 2017-04-07 LAB — COMPREHENSIVE METABOLIC PANEL
ALK PHOS: 31 U/L — AB (ref 38–126)
ALT: 16 U/L (ref 14–54)
AST: 21 U/L (ref 15–41)
Albumin: 3.9 g/dL (ref 3.5–5.0)
Anion gap: 9 (ref 5–15)
BILIRUBIN TOTAL: 0.6 mg/dL (ref 0.3–1.2)
BUN: 7 mg/dL (ref 6–20)
CALCIUM: 9.4 mg/dL (ref 8.9–10.3)
CO2: 20 mmol/L — ABNORMAL LOW (ref 22–32)
CREATININE: 0.52 mg/dL (ref 0.44–1.00)
Chloride: 107 mmol/L (ref 101–111)
Glucose, Bld: 123 mg/dL — ABNORMAL HIGH (ref 65–99)
Potassium: 3.2 mmol/L — ABNORMAL LOW (ref 3.5–5.1)
Sodium: 136 mmol/L (ref 135–145)
TOTAL PROTEIN: 6.5 g/dL (ref 6.5–8.1)

## 2017-04-07 LAB — CBC
HCT: 39.7 % (ref 36.0–46.0)
HEMOGLOBIN: 13.3 g/dL (ref 12.0–15.0)
MCH: 31 pg (ref 26.0–34.0)
MCHC: 33.5 g/dL (ref 30.0–36.0)
MCV: 92.5 fL (ref 78.0–100.0)
PLATELETS: 165 10*3/uL (ref 150–400)
RBC: 4.29 MIL/uL (ref 3.87–5.11)
RDW: 12.5 % (ref 11.5–15.5)
WBC: 9.2 10*3/uL (ref 4.0–10.5)

## 2017-04-07 LAB — LIPASE, BLOOD: Lipase: 24 U/L (ref 11–51)

## 2017-04-07 LAB — HCG, QUANTITATIVE, PREGNANCY: HCG, BETA CHAIN, QUANT, S: 131458 m[IU]/mL — AB (ref <5)
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
## 2017-04-08 ENCOUNTER — Emergency Department (HOSPITAL_COMMUNITY)
Admission: EM | Admit: 2017-04-08 | Discharge: 2017-04-08 | Disposition: A | Discharge status: Home / Self Care | Payer: Medicaid Other | Attending: Emergency Medicine | Admitting: Emergency Medicine

## 2017-04-08 ENCOUNTER — Emergency Department (HOSPITAL_COMMUNITY): Payer: Medicaid Other

## 2017-04-08 DIAGNOSIS — S62512A Displaced fracture of proximal phalanx of left thumb, initial encounter for closed fracture: Secondary | ICD-10-CM

## 2017-04-08 DIAGNOSIS — Z3491 Encounter for supervision of normal pregnancy, unspecified, first trimester: Secondary | ICD-10-CM

## 2017-04-08 DIAGNOSIS — K0889 Other specified disorders of teeth and supporting structures: Secondary | ICD-10-CM

## 2017-04-08 DIAGNOSIS — S301XXA Contusion of abdominal wall, initial encounter: Secondary | ICD-10-CM

## 2017-04-08 LAB — ABO/RH: ABO/RH(D): A POS

## 2017-04-08 MED ORDER — AMOXICILLIN 500 MG PO CAPS: 500.0000 mg | ORAL_CAPSULE | Freq: Three times a day (TID) | ORAL | 0 refills | Status: DC

## 2017-04-08 NOTE — ED Notes (Signed)
Ortho tech at bedside 
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
## 2017-04-08 NOTE — Discharge Instructions (Signed)
Amoxicillin as prescribed.  Tylenol 650 mg every 6 hours as needed for pain.  Wear splint as applied until followed up by orthopedics. Ice for 20 minutes every 2 hours while awake for the next 2 days.  Follow-up with hand surgery in the next 2-3 days. The contact information for Dr. Amanda PeaGramig has been provided in this discharge summary for you to call and make these arrangements.

## 2017-04-08 NOTE — ED Notes (Addendum)
Pt reports she gets sharp, aching, throbbing pain on her rt lower jaw from incoming wisdom teeth. Pain only starts at night. Pt reports that it feels as if her tongue and throat are swollen on the rt side when she tries to swallow, difficult and painful to swallow.

## 2017-04-21 LAB — OB RESULTS CONSOLE HIV ANTIBODY (ROUTINE TESTING): HIV: NONREACTIVE

## 2017-04-21 LAB — OB RESULTS CONSOLE RUBELLA ANTIBODY, IGM: RUBELLA: IMMUNE

## 2017-04-21 LAB — OB RESULTS CONSOLE RPR: RPR: NONREACTIVE

## 2017-04-21 LAB — OB RESULTS CONSOLE GC/CHLAMYDIA
Chlamydia: NEGATIVE
GC PROBE AMP, GENITAL: NEGATIVE

## 2017-04-21 LAB — OB RESULTS CONSOLE HEPATITIS B SURFACE ANTIGEN: HEP B S AG: NEGATIVE

## 2017-09-22 LAB — OB RESULTS CONSOLE GBS: GBS: NEGATIVE

## 2017-10-06 NOTE — L&D Delivery Note (Signed)
Patient was C/C/+2 and pushed for about 10 minutes with epidural.     Baby was having deep decels with each contraction to the 60s- severe variables.   Consented pt for VE expedited delivery and d/w her all risks and benefits; she agreed to proceed.  VE applied for one contraction with one popoff; baby's head delivered spontaneously with next contraction.  SVD  female infant, Apgars 8,9, weight P.   The patient had bilateral first degree labial and perineal lacerations repaired with 3-0 Vicryl R.  Fundus was firm. EBL was expected amount. Placenta was delivered intact. Vagina was clear.  Delayed cord clamping done for 30-60 seconds while warming baby. Baby was vigorous and doing skin to skin with mother.  Sydney Allison A

## 2017-10-18 ENCOUNTER — Inpatient Hospital Stay (HOSPITAL_COMMUNITY): Payer: Medicaid Other | Admitting: Anesthesiology

## 2017-10-18 ENCOUNTER — Encounter (HOSPITAL_COMMUNITY): Payer: Self-pay

## 2017-10-18 ENCOUNTER — Inpatient Hospital Stay (HOSPITAL_COMMUNITY)
Admission: AD | Admit: 2017-10-18 | Discharge: 2017-10-20 | DRG: 807 | Disposition: A | Discharge status: Home / Self Care | Payer: Medicaid Other | Source: Ambulatory Visit | Attending: Obstetrics and Gynecology | Admitting: Obstetrics and Gynecology

## 2017-10-18 DIAGNOSIS — Z3483 Encounter for supervision of other normal pregnancy, third trimester: Secondary | ICD-10-CM | POA: Diagnosis present

## 2017-10-18 DIAGNOSIS — Z3A39 39 weeks gestation of pregnancy: Secondary | ICD-10-CM | POA: Diagnosis not present

## 2017-10-18 DIAGNOSIS — Z3490 Encounter for supervision of normal pregnancy, unspecified, unspecified trimester: Secondary | ICD-10-CM

## 2017-10-18 LAB — CBC
HEMATOCRIT: 41 % (ref 36.0–46.0)
HEMOGLOBIN: 13.9 g/dL (ref 12.0–15.0)
MCH: 30.8 pg (ref 26.0–34.0)
MCHC: 33.9 g/dL (ref 30.0–36.0)
MCV: 90.9 fL (ref 78.0–100.0)
Platelets: 195 10*3/uL (ref 150–400)
RBC: 4.51 MIL/uL (ref 3.87–5.11)
RDW: 12.6 % (ref 11.5–15.5)
WBC: 9.5 10*3/uL (ref 4.0–10.5)

## 2017-10-18 LAB — URINALYSIS, ROUTINE W REFLEX MICROSCOPIC
Bilirubin Urine: NEGATIVE
Glucose, UA: NEGATIVE mg/dL
Hgb urine dipstick: NEGATIVE
Ketones, ur: NEGATIVE mg/dL
LEUKOCYTES UA: NEGATIVE
NITRITE: NEGATIVE
PROTEIN: NEGATIVE mg/dL
SPECIFIC GRAVITY, URINE: 1.017 (ref 1.005–1.030)
pH: 5 (ref 5.0–8.0)

## 2017-10-18 LAB — ABO/RH: ABO/RH(D): A POS

## 2017-10-18 LAB — TYPE AND SCREEN
ABO/RH(D): A POS
ANTIBODY SCREEN: NEGATIVE

## 2017-10-18 MED ORDER — EPHEDRINE 5 MG/ML INJ
10.0000 mg | INTRAVENOUS | Status: DC | PRN
  Filled 2017-10-18: qty 2

## 2017-10-18 MED ORDER — SODIUM CHLORIDE 0.9% FLUSH: 3.0000 mL | Freq: Two times a day (BID) | INTRAVENOUS | Status: DC

## 2017-10-18 MED ORDER — OXYCODONE-ACETAMINOPHEN 5-325 MG PO TABS: 2.0000 | ORAL_TABLET | ORAL | Status: DC | PRN

## 2017-10-18 MED ORDER — METHYLERGONOVINE MALEATE 0.2 MG PO TABS: 0.2000 mg | ORAL_TABLET | ORAL | Status: DC | PRN

## 2017-10-18 MED ORDER — PRENATAL MULTIVITAMIN CH
1.0000 | ORAL_TABLET | Freq: Every day | ORAL | Status: DC
  Administered 2017-10-19 – 2017-10-20 (×2): 1 via ORAL
  Filled 2017-10-18 (×2): qty 1

## 2017-10-18 MED ORDER — ACETAMINOPHEN 325 MG PO TABS
650.0000 mg | ORAL_TABLET | ORAL | Status: DC | PRN
  Filled 2017-10-18: qty 2

## 2017-10-18 MED ORDER — FERROUS SULFATE 325 (65 FE) MG PO TABS
325.0000 mg | ORAL_TABLET | Freq: Two times a day (BID) | ORAL | Status: DC
  Administered 2017-10-19 – 2017-10-20 (×2): 325 mg via ORAL
  Filled 2017-10-18 (×3): qty 1

## 2017-10-18 MED ORDER — BENZOCAINE-MENTHOL 20-0.5 % EX AERO
1.0000 "application " | INHALATION_SPRAY | CUTANEOUS | Status: DC | PRN
  Filled 2017-10-18: qty 56

## 2017-10-18 MED ORDER — LACTATED RINGERS IV SOLN
500.0000 mL | Freq: Once | INTRAVENOUS | Status: AC
  Administered 2017-10-18: 500 mL via INTRAVENOUS

## 2017-10-18 MED ORDER — PHENYLEPHRINE 40 MCG/ML (10ML) SYRINGE FOR IV PUSH (FOR BLOOD PRESSURE SUPPORT)
80.0000 ug | PREFILLED_SYRINGE | INTRAVENOUS | Status: DC | PRN
  Filled 2017-10-18: qty 5
  Filled 2017-10-18: qty 10

## 2017-10-18 MED ORDER — SIMETHICONE 80 MG PO CHEW: 80.0000 mg | CHEWABLE_TABLET | ORAL | Status: DC | PRN

## 2017-10-18 MED ORDER — SODIUM CHLORIDE 0.9 % IV SOLN: 250.0000 mL | INTRAVENOUS | Status: DC | PRN

## 2017-10-18 MED ORDER — DIPHENHYDRAMINE HCL 25 MG PO CAPS: 25.0000 mg | ORAL_CAPSULE | Freq: Four times a day (QID) | ORAL | Status: DC | PRN

## 2017-10-18 MED ORDER — MEASLES, MUMPS & RUBELLA VAC ~~LOC~~ INJ
0.5000 mL | INJECTION | Freq: Once | SUBCUTANEOUS | Status: DC
  Filled 2017-10-18: qty 0.5

## 2017-10-18 MED ORDER — DIPHENHYDRAMINE HCL 50 MG/ML IJ SOLN: 12.5000 mg | INTRAMUSCULAR | Status: DC | PRN

## 2017-10-18 MED ORDER — MAGNESIUM HYDROXIDE 400 MG/5ML PO SUSP: 30.0000 mL | ORAL | Status: DC | PRN

## 2017-10-18 MED ORDER — SOD CITRATE-CITRIC ACID 500-334 MG/5ML PO SOLN: 30.0000 mL | ORAL | Status: DC | PRN

## 2017-10-18 MED ORDER — LACTATED RINGERS IV SOLN
INTRAVENOUS | Status: DC
  Administered 2017-10-18 (×3): via INTRAVENOUS

## 2017-10-18 MED ORDER — PHENYLEPHRINE 40 MCG/ML (10ML) SYRINGE FOR IV PUSH (FOR BLOOD PRESSURE SUPPORT)
80.0000 ug | PREFILLED_SYRINGE | INTRAVENOUS | Status: DC | PRN
  Filled 2017-10-18: qty 5

## 2017-10-18 MED ORDER — SODIUM BICARBONATE 8.4 % IV SOLN
INTRAVENOUS | Status: DC | PRN
  Administered 2017-10-18: 3 mL via EPIDURAL
  Administered 2017-10-18: 4 mL via EPIDURAL

## 2017-10-18 MED ORDER — WITCH HAZEL-GLYCERIN EX PADS: 1.0000 "application " | MEDICATED_PAD | CUTANEOUS | Status: DC | PRN

## 2017-10-18 MED ORDER — SENNOSIDES-DOCUSATE SODIUM 8.6-50 MG PO TABS
2.0000 | ORAL_TABLET | ORAL | Status: DC
  Administered 2017-10-18: 2 via ORAL
  Filled 2017-10-18 (×2): qty 2

## 2017-10-18 MED ORDER — LACTATED RINGERS IV SOLN: 500.0000 mL | INTRAVENOUS | Status: DC | PRN

## 2017-10-18 MED ORDER — ONDANSETRON HCL 4 MG/2ML IJ SOLN: 4.0000 mg | INTRAMUSCULAR | Status: DC | PRN

## 2017-10-18 MED ORDER — SODIUM CHLORIDE 0.9% FLUSH: 3.0000 mL | INTRAVENOUS | Status: DC | PRN

## 2017-10-18 MED ORDER — ONDANSETRON HCL 4 MG PO TABS: 4.0000 mg | ORAL_TABLET | ORAL | Status: DC | PRN

## 2017-10-18 MED ORDER — OXYTOCIN BOLUS FROM INFUSION: 500.0000 mL | Freq: Once | INTRAVENOUS | Status: DC

## 2017-10-18 MED ORDER — COCONUT OIL OIL
1.0000 "application " | TOPICAL_OIL | Status: DC | PRN
  Administered 2017-10-19: 1 via TOPICAL
  Filled 2017-10-18: qty 120

## 2017-10-18 MED ORDER — ONDANSETRON HCL 4 MG/2ML IJ SOLN: 4.0000 mg | Freq: Four times a day (QID) | INTRAMUSCULAR | Status: DC | PRN

## 2017-10-18 MED ORDER — TETANUS-DIPHTH-ACELL PERTUSSIS 5-2.5-18.5 LF-MCG/0.5 IM SUSP: 0.5000 mL | Freq: Once | INTRAMUSCULAR | Status: DC

## 2017-10-18 MED ORDER — ZOLPIDEM TARTRATE 5 MG PO TABS: 5.0000 mg | ORAL_TABLET | Freq: Every evening | ORAL | Status: DC | PRN

## 2017-10-18 MED ORDER — OXYTOCIN 40 UNITS IN LACTATED RINGERS INFUSION - SIMPLE MED
2.5000 [IU]/h | INTRAVENOUS | Status: DC
  Administered 2017-10-18: 2.5 [IU]/h via INTRAVENOUS
  Filled 2017-10-18: qty 1000

## 2017-10-18 MED ORDER — IBUPROFEN 800 MG PO TABS
800.0000 mg | ORAL_TABLET | Freq: Three times a day (TID) | ORAL | Status: DC
  Administered 2017-10-18 – 2017-10-20 (×6): 800 mg via ORAL
  Filled 2017-10-18 (×6): qty 1

## 2017-10-18 MED ORDER — LIDOCAINE HCL (PF) 1 % IJ SOLN
30.0000 mL | INTRAMUSCULAR | Status: DC | PRN
  Filled 2017-10-18: qty 30

## 2017-10-18 MED ORDER — METHYLERGONOVINE MALEATE 0.2 MG/ML IJ SOLN: 0.2000 mg | INTRAMUSCULAR | Status: DC | PRN

## 2017-10-18 MED ORDER — OXYCODONE-ACETAMINOPHEN 5-325 MG PO TABS: 1.0000 | ORAL_TABLET | ORAL | Status: DC | PRN

## 2017-10-18 MED ORDER — ACETAMINOPHEN 325 MG PO TABS
650.0000 mg | ORAL_TABLET | ORAL | Status: DC | PRN
  Administered 2017-10-19 – 2017-10-20 (×5): 650 mg via ORAL
  Filled 2017-10-18 (×6): qty 2

## 2017-10-18 MED ORDER — FENTANYL 2.5 MCG/ML BUPIVACAINE 1/10 % EPIDURAL INFUSION (WH - ANES)
14.0000 mL/h | INTRAMUSCULAR | Status: DC | PRN
  Administered 2017-10-18: 11 mL/h via EPIDURAL
  Filled 2017-10-18: qty 100

## 2017-10-18 MED ORDER — DIBUCAINE 1 % RE OINT: 1.0000 "application " | TOPICAL_OINTMENT | RECTAL | Status: DC | PRN

## 2017-10-18 MED ORDER — FLEET ENEMA 7-19 GM/118ML RE ENEM: 1.0000 | ENEMA | RECTAL | Status: DC | PRN

## 2017-10-18 NOTE — MAU Note (Signed)
PATIENT PRESENTS WITH CONTRACTIONS EVERY 5 TO 7 MINUTES.

## 2017-10-18 NOTE — Progress Notes (Signed)
5 big sponges 5 small sponges 10 instruments 2 needles

## 2017-10-18 NOTE — Progress Notes (Signed)
Pt comfortable with epidural.  Vitals:   10/18/17 1429 10/18/17 1430 10/18/17 1500 10/18/17 1546  BP:  120/67 (!) 103/58   Pulse:  90 92   Resp: 20 18 20 18   Temp:  97.6 F (36.4 C)    TempSrc:  Oral    SpO2:      Weight:      Height:       FHTs pt had a 6 minute decel to 60-70s that resolved with position and O2.  Now 120s with gSTV and NST R. Toco q 8 minutes SVE 8/C/-1, AROm clear.  Continue augmentation.  Cat 1 FHTs now.

## 2017-10-18 NOTE — MAU Note (Signed)
Pt presents with c/o ctxs that began @ 0600 this am.  Reports ctxs awakened her from sleep.  Denies VB or LOF.  Reports +FM.   Last VE 10/16/17, reports 1.5cms

## 2017-10-18 NOTE — Anesthesia Preprocedure Evaluation (Signed)
Anesthesia Evaluation  Patient identified by MRN, date of birth, ID band Patient awake    History of Anesthesia Complications Negative for: history of anesthetic complications  Airway Mallampati: II  TM Distance: >3 FB Neck ROM: Full    Dental   Pulmonary neg pulmonary ROS,    Pulmonary exam normal        Cardiovascular negative cardio ROS Normal cardiovascular exam Rhythm:Regular     Neuro/Psych  Headaches, Anxiety    GI/Hepatic negative GI ROS, Neg liver ROS,   Endo/Other  negative endocrine ROS  Renal/GU negative Renal ROS  negative genitourinary   Musculoskeletal negative musculoskeletal ROS (+)   Abdominal   Peds negative pediatric ROS (+)  Hematology negative hematology ROS (+)   Anesthesia Other Findings   Reproductive/Obstetrics (+) Pregnancy                             Anesthesia Physical Anesthesia Plan  ASA: II  Anesthesia Plan: Epidural   Post-op Pain Management:    Induction:   PONV Risk Score and Plan:   Airway Management Planned:   Additional Equipment:   Intra-op Plan:   Post-operative Plan:   Informed Consent:   Plan Discussed with:   Anesthesia Plan Comments:         Anesthesia Quick Evaluation

## 2017-10-18 NOTE — Anesthesia Procedure Notes (Signed)
Epidural Patient location during procedure: OB Start time: 10/18/2017 10:20 AM  Staffing Anesthesiologist: Odette FractionHarkins, Akaya Proffit, MD Performed: anesthesiologist   Preanesthetic Checklist Completed: patient identified, site marked, surgical consent, pre-op evaluation, timeout performed, IV checked, risks and benefits discussed and monitors and equipment checked  Epidural Patient position: sitting Prep: DuraPrep Patient monitoring: heart rate, continuous pulse ox and blood pressure Location: L3-L4 Injection technique: LOR saline  Needle:  Needle type: Tuohy  Needle gauge: 17 G Needle length: 9 cm and 9 Needle insertion depth: 6 cm Catheter type: closed end flexible Catheter size: 20 Guage Catheter at skin depth: 13 cm Test dose: negative and 2% lidocaine with Epi 1:200 K  Assessment Sensory level: T10 Events: blood not aspirated, injection not painful, no injection resistance, negative IV test and no paresthesia  Additional Notes Patient identified. Risks/Benefits/Options discussed with patient including but not limited to bleeding, infection, nerve damage, paralysis, failed block, incomplete pain control, headache, blood pressure changes, nausea, vomiting, reactions to medication both or allergic, itching and postpartum back pain. Confirmed with bedside nurse the patient's most recent platelet count. Confirmed with patient that they are not currently taking any anticoagulation, have any bleeding history or any family history of bleeding disorders. Patient expressed understanding and wished to proceed. All questions were answered. Sterile technique was used throughout the entire procedure. Please see nursing notes for vital signs. Test dose was given through epidural catheter and negative prior to continuing to dose epidural or start infusion.  Patient was given instructions on fall risk and not to get out of bed. All questions and concerns addressed with instructions to call with any  issues.

## 2017-10-18 NOTE — Progress Notes (Signed)
Dr Henderson Cloudhorvath updated on sve and exam progress. To recheck pt in 1 hour, if no change- start pitocin per verbal order. If progressing, continue expectant management.

## 2017-10-18 NOTE — H&P (Signed)
27 y.o. 3579w1d  G2P1001 comes in c/o labor.  Otherwise has good fetal movement and no bleeding.  Past Medical History:  Diagnosis Date  . [id number](478.2Headache(784.0)     Past Surgical History:  Procedure Laterality Date  . NO PAST SURGERIES      OB History  Gravida Para Term Preterm AB Living  2 1 1     1   SAB TAB Ectopic Multiple Live Births        1 1    # Outcome Date GA Lbr Len/2nd Weight Sex Delivery Anes PTL Lv  2A Gravida           2B Current           1 Term 09/03/11 7244w6d 08:21 / 01:41 8 lb 0.9 oz (3.654 kg) M Vag-Spont   LIV     Birth Comments: caput      Social History   Socioeconomic History  . Marital status: Single    Spouse name: Not on file  . Number of children: Not on file  . Years of education: Not on file  . Highest education level: Not on file  Social Needs  . Financial resource strain: Not on file  . Food insecurity - worry: Not on file  . Food insecurity - inability: Not on file  . Transportation needs - medical: Not on file  . Transportation needs - non-medical: Not on file  Occupational History  . Not on file  Tobacco Use  . Smoking status: Never Smoker  . Smokeless tobacco: Never Used  Substance and Sexual Activity  . Alcohol use: No  . Drug use: No  . Sexual activity: Yes    Birth control/protection: None  Other Topics Concern  . Not on file  Social History Narrative  . Not on file   Latex and Bee venom    Prenatal Transfer Tool  Maternal Diabetes: No Genetic Screening: Normal Maternal Ultrasounds/Referrals: Normal Fetal Ultrasounds or other Referrals:  None Maternal Substance Abuse:  No Significant Maternal Medications:  None Significant Maternal Lab Results: None  Other PNC: uncomplicated.    Vitals:   10/18/17 0758 10/18/17 0820  BP: (!) 128/108 122/75  Pulse: 85 90  Resp: 16   Temp: (!) 97.5 F (36.4 C)   TempSrc: Oral     Lungs/Cor:  NAD Abdomen:  soft, gravid Ex:  no cords, erythema SVE:  3.5/70/-2 FHTs:  120s, good  STV, NST R Toco:  q 3-5   A/P   Term labor.   GBS neg.  Cheyna Retana A

## 2017-10-18 NOTE — Anesthesia Pain Management Evaluation Note (Signed)
  CRNA Pain Management Visit Note  Patient: Sydney Allison, 27 y.o., female  "Hello I am a member of the anesthesia team at [facility]. We have an anesthesia team available at all times to provide care throughout the hospital, including epidural management and anesthesia for C-section. I don't know your plan for the delivery whether it a natural birth, water birth, IV sedation, nitrous supplementation, doula or epidural, but we want to meet your pain goals."   1.Was your pain managed to your expectations on prior hospitalizations?   Yes   2.What is your expectation for pain management during this hospitalization?     Epidural  3.How can we help you reach that goal? Epidural  Record the patient's initial score and the patient's pain goal.   Pain: 1  Pain Goal: 5 [facility] wants you to be able to say your pain was always managed very well.  Sydney Allison,Sydney Allison 10/18/2017

## 2017-10-19 LAB — CBC
HEMATOCRIT: 36.4 % (ref 36.0–46.0)
Hemoglobin: 12.5 g/dL (ref 12.0–15.0)
MCH: 31 pg (ref 26.0–34.0)
MCHC: 34.3 g/dL (ref 30.0–36.0)
MCV: 90.3 fL (ref 78.0–100.0)
PLATELETS: 165 10*3/uL (ref 150–400)
RBC: 4.03 MIL/uL (ref 3.87–5.11)
RDW: 12.7 % (ref 11.5–15.5)
WBC: 11.5 10*3/uL — ABNORMAL HIGH (ref 4.0–10.5)

## 2017-10-19 LAB — RPR: RPR Ser Ql: NONREACTIVE

## 2017-10-19 NOTE — Anesthesia Postprocedure Evaluation (Signed)
Anesthesia Post Note  Patient: Sydney Allison  Procedure(s) Performed: AN AD HOC LABOR EPIDURAL     Patient location during evaluation: Mother Baby Anesthesia Type: Epidural Level of consciousness: awake and alert and oriented Pain management: satisfactory to patient Vital Signs Assessment: post-procedure vital signs reviewed and stable Respiratory status: respiratory function stable Cardiovascular status: stable Postop Assessment: no headache, no backache, epidural receding, patient able to bend at knees, no signs of nausea or vomiting and adequate PO intake Anesthetic complications: no    Last Vitals:  Vitals:   10/19/17 0121 10/19/17 0500  BP: (!) 117/56 112/65  Pulse: 72 69  Resp: 18 18  Temp: 36.8 C 36.7 C  SpO2:      Last Pain:  Vitals:   10/19/17 0720  TempSrc:   PainSc: Asleep   Pain Goal:                 [name]

## 2017-10-19 NOTE — Progress Notes (Signed)
Patient is eating, ambulating, voiding.  Pain control is good.  Appropriate lochia, no complaints.  Vitals:   10/18/17 2135 10/19/17 0121 10/19/17 0500 10/19/17 0935  BP: 122/67 (!) 117/56 112/65 133/80  Pulse: 94 72 69 87  Resp: 18 18 18 18   Temp: 98 F (36.7 C) 98.2 F (36.8 C) 98 F (36.7 C) 98.2 F (36.8 C)  TempSrc: Oral Oral Oral Oral  SpO2: 98%     Weight:      Height:        Fundus firm Mons without swelling Ext: no calf tenderness  Lab Results  Component Value Date   WBC 11.5 (H) 10/19/2017   HGB 12.5 10/19/2017   HCT 36.4 10/19/2017   MCV 90.3 10/19/2017   PLT 165 10/19/2017    --/--/A POS (01/13 0900)  A/P Post partum day 1. Circ in office  Routine care.  Expect d/c 1/15.    Philip AspenALLAHAN, Najma Bozarth

## 2017-10-19 NOTE — Clinical Social Work Maternal (Signed)
CLINICAL SOCIAL WORK MATERNAL/CHILD NOTE  Patient Details  Name: Sydney Allison MRN: [id number] Date of Birth: 06/15/1991  Date:  10/19/2017  Clinical Social Worker Initiating Note:  Arshawn Valdez, LCSW Date/Time: Initiated:  10/19/17/1238     Child's Name:  Jacob    Biological Parents:  Mother(Rodney Delisle)   Need for Interpreter:  None   Reason for Referral:  Behavioral Health Concerns, Current CPS Involvement   Address:  [address] [place] [address] MOB reports that this is FOB's mother's address. MOB is currently residing with her aunt and uncle at: [address] [place], [place] [address]   Phone number:  [phone] (home)     Additional phone number:  Household Members/Support Persons (HM/SP):   Household Member/Support Person 1, Household Member/Support Person 2, Household Member/Support Person 3   HM/SP Name Relationship DOB or Age  HM/SP -1 Pearl Kenner aunt    HM/SP -2 Charles "Hank" Kenner uncle    HM/SP -3 Elijah Hammonds son 6  HM/SP -4        HM/SP -5        HM/SP -6        HM/SP -7        HM/SP -8          Natural Supports (not living in the home):  Extended Family, Immediate Family(MOB reports that her aunt and his mom are her main supports and states that she has a few sisters who are supportive.)   Professional Supports: Case Manager/Social Worker(MOB has a CPS worker at [place])   Employment:     Type of Work:     Education:      Homebound arranged:    Financial Resources:  Medicaid   Other Resources:      Cultural/Religious Considerations Which May Impact Care: None stated.  MOB's facesheet notes religion as Baptist.  Strengths:  Ability to meet basic needs , Home prepared for child (MOB states she took her son to [facility] prior to losing custody.  Child then went to [facility].  MOB is not sure where she will follow up with baby now that she assumes her custody will be  restored in court next week.)   Psychotropic Medications:         Pediatrician:       Pediatrician List:   [place]    [place]     [place]    [place]    [place]    [place]      Pediatrician Fax Number:    Risk Factors/Current Problems:  DHHS Involvement , Mental Health Concerns , Legal Issues (Hx of Anx/Dep-MOB reports no current concerns, Open CPS case-[place], FOB in jail.)   Cognitive State:  Goal Oriented , Insightful , Linear Thinking , Alert , Able to Concentrate    Mood/Affect:  Calm (quiet)   CSW Assessment: CSW met with MOB in her first floor room/113 to offer support and complete assessment.  MOB was very quiet, but pleasant and receptive to the visit.  She was holding baby, who slept through the assessment, while lying in bed.   MOB acknowledges increased emotionality given what she has been through with an open CPS case and losing custody of her 25 year old during the pregnancy, which CSW was aware of given PNR notes and conversation with CPS worker, but did not seem interested in processing her feelings related to this.  CSW informed of the need to notify CPS that   she has delivered her baby and congratulated her on what appears to be a case that could be closed soon.  CPS worker informed CSW that MOB has been granted trial home visits since November and the hope is that her custody will be restored in court next week.  MOB confirmed this and states that she is happy that baby is here and that she will hopefully have her case closed with CPS.  She was understanding of CSW's need to inform of baby's birth and states no concerns.  Although MOB gave a [place] address, CSW plans to contact [place], where her case is currently open.  CSW left message for [place] intake and will plan to ensure no barriers to discharge prior to baby's discharge from the hospital.   MOB reports that FOB is currently in jail  and that their relationship status is "pending."  She did not elaborate on this, but reports that she has a good support system in her aunt and FOB's mother.  She reports no current emotional concerns, but was engaged and attentive to CSW's discussion of PMADs.  MOB was not aware of SIDS precautions, but was also attentive to information given by CSW regarding this.   CSW does not anticipate any barriers to discharge, but would like to discuss with CPS worker prior to baby's discharge from the hospital.  CSW Plan/Description:  Sudden Infant Death Syndrome (SIDS) Education, Perinatal Mood and Anxiety Disorder (PMADs) Education, Child Protective Service Report , CSW Awaiting CPS Disposition Plan    Gloriana Piltz Elizabeth, LCSW 10/19/2017, 12:45 PM 

## 2017-10-20 LAB — BIRTH TISSUE RECOVERY COLLECTION (PLACENTA DONATION)

## 2017-10-20 MED ORDER — IBUPROFEN 800 MG PO TABS: 800.0000 mg | ORAL_TABLET | Freq: Three times a day (TID) | ORAL | 0 refills | Status: DC

## 2017-10-20 NOTE — Progress Notes (Signed)
CSW has called [place] CPS Intake line multiple times today and yesterday to make report without receiving a returned call.  CSW spoke with foster care social worker/T. Egg[place], who spoke with CPS supervisor/M. Kaneko and reports no barriers to discharge when medically ready.  CPS worker will follow up in the home.  CSW informed CN staff. 

## 2017-10-20 NOTE — Discharge Summary (Signed)
Obstetric Discharge Summary Reason for Admission: onset of labor Prenatal Procedures: ultrasound Intrapartum Procedures: vacuum Postpartum Procedures: none Complications-Operative and Postpartum: 1st degree perineal laceration Hemoglobin  Date Value Ref Range Status  10/19/2017 12.5 12.0 - 15.0 g/dL Final   HCT  Date Value Ref Range Status  10/19/2017 36.4 36.0 - 46.0 % Final    Physical Exam:  General: alert Lochia: appropriate Uterine Fundus: firm   Discharge Diagnoses: Term Pregnancy-delivered  Discharge Information: Date: 10/20/2017 Activity: pelvic rest Diet: routine Medications: PNV and Ibuprofen Condition: stable Instructions: refer to practice specific booklet Discharge to: home Follow-up Information    Carrington ClampHorvath, Michelle, MD Follow up.   Specialty:  Obstetrics and Gynecology Contact information: [address] [place] [address] [phone]           Newborn Data: Live born female  Birth Weight: 7 lb 7.8 oz (3396 g) APGAR: 8, 9  Newborn Delivery   Birth date/time:  10/18/2017 18:25:00 Delivery type:  Vaginal, Vacuum (Extractor)     Home with mother.  ANDERSON,MARK E 10/20/2017, 8:37 AM

## 2017-10-20 NOTE — Plan of Care (Signed)
Patient progressing appropriately. Taking care of baby appropriately.

## 2017-10-20 NOTE — Lactation Note (Signed)
This note was copied from a baby's chart. Lactation Consultation Note  Patient Name: Boy Allyne Geerycinthia Margerum WUXLK'GToday's Date: 10/20/2017 Reason for consult: Follow-up assessment   Baby 40 hours old and latched upon entering w/ lips flanged. Baby has been sleepy at the breast. Discussed breast compression and feeding STS. Mom encouraged to feed baby 8-12 times/24 hours and with feeding cues.  Provided mother w/ manual pump. Reviewed engorgement care and monitoring voids/stools.    Maternal Data    Feeding Feeding Type: Breast Fed Length of feed: 10 min  LATCH Score Latch: Grasps breast easily, tongue down, lips flanged, rhythmical sucking.(latched upon entering)  Audible Swallowing: A few with stimulation  Type of Nipple: Everted at rest and after stimulation  Comfort (Breast/Nipple): Soft / non-tender  Hold (Positioning): No assistance needed to correctly position infant at breast.  LATCH Score: 9  Interventions Interventions: Hand pump  Lactation Tools Discussed/Used     Consult Status Consult Status: Complete    Hardie PulleyBerkelhammer, Ruth Boschen 10/20/2017, 10:43 AM

## 2017-10-20 NOTE — Progress Notes (Signed)
PPD#2 Pt without complaints. Lochia wnl VSSAF IMP/ Stable Plan/Will discharge. 

## 2018-04-07 ENCOUNTER — Encounter: Payer: Self-pay | Admitting: *Deleted

## 2018-04-07 ENCOUNTER — Other Ambulatory Visit: Payer: Self-pay

## 2018-04-07 ENCOUNTER — Ambulatory Visit: Payer: Medicaid Other | Admitting: Obstetrics

## 2018-04-07 ENCOUNTER — Encounter: Payer: Self-pay | Admitting: Obstetrics

## 2018-04-07 VITALS — BP 110/67 | HR 66 | Ht 62.0 in | Wt 144.3 lb

## 2018-04-07 DIAGNOSIS — Z3046 Encounter for surveillance of implantable subdermal contraceptive: Secondary | ICD-10-CM | POA: Diagnosis not present

## 2018-04-07 DIAGNOSIS — Z3009 Encounter for other general counseling and advice on contraception: Secondary | ICD-10-CM

## 2018-04-07 DIAGNOSIS — Z3202 Encounter for pregnancy test, result negative: Secondary | ICD-10-CM | POA: Diagnosis not present

## 2018-04-07 DIAGNOSIS — Z30017 Encounter for initial prescription of implantable subdermal contraceptive: Secondary | ICD-10-CM

## 2018-04-07 LAB — POCT URINE PREGNANCY: PREG TEST UR: NEGATIVE

## 2018-04-07 MED ORDER — ETONOGESTREL 68 MG ~~LOC~~ IMPL
68.0000 mg | DRUG_IMPLANT | Freq: Once | SUBCUTANEOUS | Status: AC
  Administered 2018-04-07: 68 mg via SUBCUTANEOUS

## 2018-04-07 NOTE — Progress Notes (Signed)
Nexplanon Procedure Note   PRE-OP DIAGNOSIS: desired long-term, reversible contraception ( LARC ) POST-OP DIAGNOSIS: Same  PROCEDURE: Nexplanon  placement Performing Provider: Brock BadHARLES A. Keilee Denman MD  Patient education prior to procedure, explained risk, benefits of Nexplanon, reviewed alternative options. Patient reported understanding. Gave consent to continue with procedure.   PROCEDURE:  Pregnancy Text :  Negative Site (check):      left arm         Sterile Preparation:   Betadinex3 Lot # [id number] Expiration Date: 2021  OCT  16  Insertion site was selected 8 - 10 cm from medial epicondyle and marked along with guiding site using sterile marker. Procedure area was prepped and draped in a sterile fashion. 1% Lidocaine 1.5 ml given prior to procedure. Nexplanon  was inserted subcutaneously.Needle was removed from the insertion site. Nexplanon capsule was palpated by provider and patient to assure satisfactory placement. Dressing applied.  Followup: The patient tolerated the procedure well without complications.  Standard post-procedure care is explained and return precautions are given.  Brock BadHARLES A. Kayler Rise MD 04-07-2018

## 2018-04-07 NOTE — Progress Notes (Signed)
New GYN presents for AEX/Birth Control Consult, she is thinking about the Nexplanon.  Last PAP 04/24/17.  UPT today is NEGATIVE.  Nexplanon inserted in left arm. Office stock used.  Administrations This Visit    etonogestrel (NEXPLANON) implant 68 mg    Admin Date 04/07/2018 Action Given Dose 68 mg Route Subdermal Administered By Maretta BeesMcGlashan, Hettie Roselli J, RMA

## 2018-04-21 ENCOUNTER — Ambulatory Visit: Payer: Self-pay | Admitting: Obstetrics
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
## 2018-09-14 ENCOUNTER — Encounter (HOSPITAL_COMMUNITY): Payer: Self-pay | Admitting: Emergency Medicine

## 2018-09-14 ENCOUNTER — Emergency Department (HOSPITAL_COMMUNITY)
Admission: EM | Admit: 2018-09-14 | Discharge: 2018-09-14 | Disposition: A | Discharge status: Home / Self Care | Payer: Medicaid Other | Attending: Emergency Medicine | Admitting: Emergency Medicine

## 2018-09-14 ENCOUNTER — Other Ambulatory Visit: Payer: Self-pay

## 2018-09-14 DIAGNOSIS — B9689 Other specified bacterial agents as the cause of diseases classified elsewhere: Secondary | ICD-10-CM | POA: Insufficient documentation

## 2018-09-14 DIAGNOSIS — N760 Acute vaginitis: Secondary | ICD-10-CM | POA: Diagnosis not present

## 2018-09-14 DIAGNOSIS — Z79899 Other long term (current) drug therapy: Secondary | ICD-10-CM | POA: Insufficient documentation

## 2018-09-14 DIAGNOSIS — Z7251 High risk heterosexual behavior: Secondary | ICD-10-CM | POA: Insufficient documentation

## 2018-09-14 DIAGNOSIS — R3989 Other symptoms and signs involving the genitourinary system: Secondary | ICD-10-CM | POA: Diagnosis present

## 2018-09-14 LAB — WET PREP, GENITAL
SPERM: NONE SEEN
TRICH WET PREP: NONE SEEN
Yeast Wet Prep HPF POC: NONE SEEN

## 2018-09-14 LAB — GC/CHLAMYDIA PROBE AMP (~~LOC~~) NOT AT ARMC
CHLAMYDIA, DNA PROBE: NEGATIVE
Chlamydia: NEGATIVE
NEISSERIA GONORRHEA: NEGATIVE
Neisseria Gonorrhea: NEGATIVE

## 2018-09-14 LAB — GROUP A STREP BY PCR: Group A Strep by PCR: NOT DETECTED

## 2018-09-14 MED ORDER — AZITHROMYCIN 250 MG PO TABS
1000.0000 mg | ORAL_TABLET | Freq: Once | ORAL | Status: AC
  Administered 2018-09-14: 1000 mg via ORAL
  Filled 2018-09-14: qty 4

## 2018-09-14 MED ORDER — STERILE WATER FOR INJECTION IJ SOLN
INTRAMUSCULAR | Status: AC
  Filled 2018-09-14: qty 10

## 2018-09-14 MED ORDER — VALACYCLOVIR HCL 500 MG PO TABS
1000.0000 mg | ORAL_TABLET | Freq: Two times a day (BID) | ORAL | Status: AC
  Administered 2018-09-14: 1000 mg via ORAL
  Filled 2018-09-14: qty 2

## 2018-09-14 MED ORDER — METRONIDAZOLE 0.75 % VA GEL: 1.0000 | Freq: Two times a day (BID) | VAGINAL | 0 refills | Status: AC

## 2018-09-14 MED ORDER — CEFTRIAXONE SODIUM 250 MG IJ SOLR
250.0000 mg | Freq: Once | INTRAMUSCULAR | Status: AC
  Administered 2018-09-14: 250 mg via INTRAMUSCULAR
  Filled 2018-09-14: qty 250

## 2018-09-14 MED ORDER — VALACYCLOVIR HCL 1 G PO TABS: 1000.0000 mg | ORAL_TABLET | Freq: Two times a day (BID) | ORAL | 0 refills | Status: AC

## 2018-09-14 NOTE — ED Triage Notes (Addendum)
Pt reports lesions in her vaginal area and wants an std check. Pt reports sore throat this morning and coughed up a clot. Pt reports she feels like her throat is scratchy, sore and swollen, rt side more than left. Denies throat closing feeling. Pt is breast feeding and is concerned about if she should continue.

## 2018-09-14 NOTE — ED Provider Notes (Signed)
[facility] EMERGENCY DEPARTMENT Provider Note   CSN: [id number] Arrival date & time: 09/14/18  0033    History   Chief Complaint Chief Complaint  Patient presents with  . Sore Throat  . Exposure to STD    HPI Sydney Allison is a 27 y.o. female.   27 year old female presents to the emergency department for evaluation of genital lesions.  She states that she noticed a sore on her clitoris a few days ago.  She has since developed a few more bumps in her external genitalia making her concern for an STD.  She reports recent sexual encounter without the use of protection.  Has been sexually active with this partner for the past 3 months.  Noticed onset of a sore throat this morning.  Discomfort is worse with swallowing.  She is concerned that her sore throat and her genital lesions are related.  She has not had any fevers, vomiting, diarrhea, inability to swallow, drooling.  She denies taking any medications for her symptoms.  She is currently breast-feeding.  The history is provided by the patient. No language interpreter was used.  Sore Throat   Exposure to STD     Past Medical History:  Diagnosis Date  . WUJWJXBJ(478.2)     Patient Active Problem List   Diagnosis Date Noted  . Normal pregnancy 10/18/2017  . Postpartum state 10/18/2017  . Mood disorder (HCC) 04/17/2013  . PTSD (post-traumatic stress disorder) 04/17/2013    Past Surgical History:  Procedure Laterality Date  . NO PAST SURGERIES       OB History    Gravida  2   Para  2   Term  2   Preterm      AB      Living  2     SAB      TAB      Ectopic      Multiple  0   Live Births  2            Home Medications    Prior to Admission medications   Medication Sig Start Date End Date Taking? Authorizing Provider  ibuprofen (ADVIL,MOTRIN) 800 MG tablet Take 1 tablet (800 mg total) by mouth 3 (three) times daily. Patient not taking: Reported on 04/07/2018 10/20/17    Levi Aland, MD  metroNIDAZOLE (METROGEL VAGINAL) 0.75 % vaginal gel Place 1 Applicatorful vaginally 2 (two) times daily for 5 days. 09/14/18 09/19/18  Antony Madura, PA-C  Prenatal Vit-Fe Fumarate-FA (PRENATAL MULTIVITAMIN) TABS tablet Take 1 tablet by mouth daily at 12 noon.    [provider]  valACYclovir (VALTREX) 1000 MG tablet Take 1 tablet (1,000 mg total) by mouth 2 (two) times daily for 7 days. 09/14/18 09/21/18  Antony Madura, PA-C    Family History Family History  Problem Relation Age of Onset  . Cancer Mother   . Depression Mother   . Hypertension Mother   . Alcohol abuse Father   . Miscarriages / Stillbirths Sister   . Diabetes Maternal Aunt   . Diabetes Maternal Uncle   . Obesity Maternal Uncle   . Diabetes Paternal Aunt   . Diabetes Paternal Uncle     Social History Social History   Tobacco Use  . Smoking status: Never Smoker  . Smokeless tobacco: Never Used  Substance Use Topics  . Alcohol use: No  . Drug use: No     Allergies   Latex and Bee venom   Review of  Systems Review of Systems Ten systems reviewed and are negative for acute change, except as noted in the HPI.    Physical Exam Updated Vital Signs BP 120/71 (BP Location: Right Arm)   Pulse 78   Temp 98.8 F (37.1 C) (Oral)   Resp 18   Ht 5\' 1"  (1.549 m)   Wt 67.1 kg   LMP 08/15/2018   SpO2 100%   BMI 27.96 kg/m   Physical Exam  Constitutional: She is oriented to person, place, and time. She appears well-developed and well-nourished. No distress.  Nontoxic appearing and in NAD  HENT:  Head: Normocephalic and atraumatic.  Mild posterior oropharyngeal erythema.  No significant edema or exudates.  Uvula midline.  Patient tolerating secretions without difficulty.  Eyes: Conjunctivae and EOM are normal. No scleral icterus.  Neck: Normal range of motion.  Pulmonary/Chest: Effort normal. No respiratory distress.  Respirations even and unlabored  Genitourinary:    Genitourinary Comments: Five ulcerative papules on the labia, one extending to the clitoris. Pale yellow malodorous discharge. Cervical friability noted. Os is closed. No CMT.  Musculoskeletal: Normal range of motion.  Neurological: She is alert and oriented to person, place, and time. She exhibits normal muscle tone. Coordination normal.  Skin: Skin is warm and dry. No rash noted. She is not diaphoretic. No erythema. No pallor.  Psychiatric: She has a normal mood and affect. Her behavior is normal.  Nursing note and vitals reviewed.    ED Treatments / Results  Labs (all labs ordered are listed, but only abnormal results are displayed) Labs Reviewed  WET PREP, GENITAL - Abnormal; Notable for the following components:      Result Value   Clue Cells Wet Prep HPF POC PRESENT (*)    WBC, Wet Prep HPF POC MANY (*)    All other components within normal limits  GROUP A STREP BY PCR  HSV 1 ANTIBODY, IGG  HSV 2 ANTIBODY, IGG  RPR  GC/CHLAMYDIA PROBE AMP ([place]) NOT AT [facility]  GC/CHLAMYDIA PROBE AMP ([place]) NOT AT [facility]    EKG None  Radiology No results found.  Procedures Procedures (including critical care time)  Medications Ordered in ED Medications  sterile water (preservative free) injection (has no administration in time range)  valACYclovir (VALTREX) tablet 1,000 mg (1,000 mg Oral Given 09/14/18 0509)  azithromycin (ZITHROMAX) tablet 1,000 mg (1,000 mg Oral Given 09/14/18 0518)  cefTRIAXone (ROCEPHIN) injection 250 mg (250 mg Intramuscular Given 09/14/18 0518)     5:00 AM Discussed Flagyl use with pharmacist given continued lactation.  Pharmacist advises use of topical MetroGel for lowest risk. Patient encouraged to consult her OBGYN prior to use, if desired.   Initial Impression / Assessment and Plan / ED Course  I have reviewed the triage vital signs and the nursing notes.  Pertinent labs & imaging results that were available during my care of the patient  were reviewed by me and considered in my medical decision making (see chart for details).     Patient to be discharged with instructions to follow up with her OBGYN. Discussed importance of using protection when sexually active. Patient understands that they have STD tests pending and that they will need to inform all sexual partners if results return positive. Patient has been treated prophylacticly with azithromycin and rocephin for GC/Chlamydia due to pts history, pelvic exam, and wet prep with increased WBCs. She has lesions concerning for genital herpes as well; started on 1g Valtrex BID x 7 days. Given  additional Rx for Metrogel for management of bacterial vaginosis. Return precautions discussed and provided. Patient discharged in stable condition with no unaddressed concerns.  Vitals:   09/14/18 0041 09/14/18 0045 09/14/18 0504  BP: 124/90  120/71  Pulse: (!) 112  78  Resp: 19  18  Temp: 97.8 F (36.6 C)  98.8 F (37.1 C)  TempSrc: Oral  Oral  SpO2: 99%  100%  Weight:  67.1 kg   Height:  5\' 1"  (1.549 m)     Final Clinical Impressions(s) / ED Diagnoses   Final diagnoses:  Genital sore  Bacterial vaginosis  History of unprotected sex    ED Discharge Orders         Ordered    valACYclovir (VALTREX) 1000 MG tablet  2 times daily     09/14/18 0508    metroNIDAZOLE (METROGEL VAGINAL) 0.75 % vaginal gel  2 times daily     09/14/18 0508           Antony MaduraHumes, Loel Betancur, PA-C 09/14/18 [address] [id number]    Wickline, Donald, MD 09/15/18 [phone]

## 2018-09-14 NOTE — ED Notes (Signed)
Patient verbalizes understanding of medications and discharge instructions. No further questions at this time. VSS and patient ambulatory at discharge.   

## 2018-09-14 NOTE — Discharge Instructions (Addendum)
Your exam today was concerning for possible STD exposure, specifically genital herpes. You have been treated for gonorrhea and chlamydia today as well. Follow-up with the health department in 48 hours for the results of your STD tests. If you test positive for STDs, notify all sexual partners of their need to be tested and treated as well. Do not engage in sexual intercourse for one week. Use a condom when sexually active.  Take Valtrex as prescribed until finished.  You were also given a prescription for MetroGel for management of bacterial vaginosis.  Higher doses of oral metronidazole tablets are not recommended while breast-feeding, though the topical gel carries less risk.  If desired, contact your OB/GYN prior to using.  You may return to the ED for new or concerning symptoms.

## 2018-09-15 LAB — HSV 2 ANTIBODY, IGG: HSV 2 Glycoprotein G Ab, IgG: <0.91 index (ref 0.00–0.90)

## 2018-09-15 LAB — RPR: RPR Ser Ql: NONREACTIVE

## 2018-09-15 LAB — HSV 1 ANTIBODY, IGG

## 2019-06-28 ENCOUNTER — Encounter (HOSPITAL_COMMUNITY): Payer: Self-pay | Admitting: Emergency Medicine

## 2019-06-28 ENCOUNTER — Ambulatory Visit (HOSPITAL_COMMUNITY)
Admission: EM | Admit: 2019-06-28 | Discharge: 2019-06-28 | Disposition: A | Discharge status: Home / Self Care | Payer: Medicaid Other | Attending: Emergency Medicine | Admitting: Emergency Medicine

## 2019-06-28 DIAGNOSIS — J029 Acute pharyngitis, unspecified: Secondary | ICD-10-CM | POA: Diagnosis present

## 2019-06-28 DIAGNOSIS — R0981 Nasal congestion: Secondary | ICD-10-CM | POA: Insufficient documentation

## 2019-06-28 LAB — POCT RAPID STREP A: Streptococcus, Group A Screen (Direct): NEGATIVE

## 2019-06-28 NOTE — Discharge Instructions (Addendum)
Your rapid strep test was negative.  A throat culture is pending and we will call you if it is positive.   Take Tylenol or ibuprofen as needed for discomfort.  Take Mucinex as needed for nasal congestion.    Consider a COVID test if your symptoms persist or worsen.    Return here if you have worsening symptoms or develop fever, chills, rash, cough, shortness of breath, or other concerns.

## 2019-06-28 NOTE — ED Triage Notes (Signed)
Patient reports nasal congestion for 2 days.

## 2019-06-28 NOTE — ED Provider Notes (Signed)
[place]    CSN: [id number] Arrival date & time: 06/28/19  1118      History   Chief Complaint Chief Complaint  Patient presents with  . Nasal Congestion    HPI Sydney Allison is a 28 y.o. female.   Patient presents with nasal congestion and sore throat x 3 days.  She states her symptoms began after being at a family bonfire.  She denies fever, chills, difficulty swallowing, cough, shortness of breath, vomiting, diarrhea, or other symptoms.  She states she has a history of strep throat and request testing.  She declines COVID test.  The history is provided by the patient.    Past Medical History:  Diagnosis Date  . OINOMVEH(209.4)     Patient Active Problem List   Diagnosis Date Noted  . Normal pregnancy 10/18/2017  . Postpartum state 10/18/2017  . Mood disorder ([place]) 04/17/2013  . PTSD (post-traumatic stress disorder) 04/17/2013    Past Surgical History:  Procedure Laterality Date  . NO PAST SURGERIES      OB History    Gravida  2   Para  2   Term  2   Preterm      AB      Living  2     SAB      TAB      Ectopic      Multiple  0   Live Births  2            Home Medications    Prior to Admission medications   Medication Sig Start Date End Date Taking? Authorizing Provider  guaiFENesin (MUCINEX) 600 MG 12 hr tablet Take by mouth 2 (two) times daily.   Yes [provider]  ibuprofen (ADVIL,MOTRIN) 800 MG tablet Take 1 tablet (800 mg total) by mouth 3 (three) times daily. Patient not taking: Reported on 04/07/2018 10/20/17   Olga Millers, MD  Prenatal Vit-Fe Fumarate-FA (PRENATAL MULTIVITAMIN) TABS tablet Take 1 tablet by mouth daily at 12 noon.    [provider]    Family History Family History  Problem Relation Age of Onset  . Cancer Mother   . Depression Mother   . Hypertension Mother   . Alcohol abuse Father   . Miscarriages / Stillbirths Sister   . Diabetes Maternal Aunt   . Diabetes  Maternal Uncle   . Obesity Maternal Uncle   . Diabetes Paternal Aunt   . Diabetes Paternal Uncle     Social History Social History   Tobacco Use  . Smoking status: Never Smoker  . Smokeless tobacco: Never Used  Substance Use Topics  . Alcohol use: No  . Drug use: No     Allergies   Latex and Bee venom   Review of Systems Review of Systems  Constitutional: Negative for chills and fever.  HENT: Positive for congestion and sore throat. Negative for ear pain and rhinorrhea.   Eyes: Negative for pain and visual disturbance.  Respiratory: Negative for cough and shortness of breath.   Cardiovascular: Negative for chest pain and palpitations.  Gastrointestinal: Negative for abdominal pain, diarrhea and vomiting.  Genitourinary: Negative for dysuria and hematuria.  Musculoskeletal: Negative for arthralgias and back pain.  Skin: Negative for color change and rash.  Neurological: Negative for seizures and syncope.  All other systems reviewed and are negative.    Physical Exam Triage Vital Signs ED Triage Vitals  Enc Vitals Group     BP 06/28/19 1140 (!)  119/59     Pulse Rate 06/28/19 1140 95     Resp 06/28/19 1140 14     Temp 06/28/19 1140 98.1 F (36.7 C)     Temp Source 06/28/19 1140 Oral     SpO2 06/28/19 1140 97 %     Weight --      Height --      Head Circumference --      Peak Flow --      Pain Score 06/28/19 1138 0     Pain Loc --      Pain Edu? --      Excl. in [place]? --    No data found.  Updated Vital Signs BP (!) 119/59 (BP Location: Left Arm)   Pulse 95   Temp 98.1 F (36.7 C) (Oral)   Resp 14   SpO2 97%   Visual Acuity Right Eye Distance:   Left Eye Distance:   Bilateral Distance:    Right Eye Near:   Left Eye Near:    Bilateral Near:     Physical Exam Vitals signs and nursing note reviewed.  Constitutional:      General: She is not in acute distress.    Appearance: She is well-developed.  HENT:     Head: Normocephalic and atraumatic.      Right Ear: Tympanic membrane normal.     Left Ear: Tympanic membrane normal.     Nose: Congestion present.     Mouth/Throat:     Mouth: Mucous membranes are moist.     Pharynx: Posterior oropharyngeal erythema present. No oropharyngeal exudate.  Eyes:     Conjunctiva/sclera: Conjunctivae normal.  Neck:     Musculoskeletal: Neck supple.  Cardiovascular:     Rate and Rhythm: Normal rate and regular rhythm.     Heart sounds: No murmur.  Pulmonary:     Effort: Pulmonary effort is normal. No respiratory distress.     Breath sounds: Normal breath sounds.  Abdominal:     General: Bowel sounds are normal.     Palpations: Abdomen is soft.     Tenderness: There is no abdominal tenderness. There is no guarding or rebound.  Skin:    General: Skin is warm and dry.     Findings: No rash.  Neurological:     Mental Status: She is alert.      UC Treatments / Results  Labs (all labs ordered are listed, but only abnormal results are displayed) Labs Reviewed  CULTURE, GROUP A STREP [facility])  POCT RAPID STREP A    EKG   Radiology No results found.  Procedures Procedures (including critical care time)  Medications Ordered in UC Medications - No data to display  Initial Impression / Assessment and Plan / UC Course  I have reviewed the triage vital signs and the nursing notes.  Pertinent labs & imaging results that were available during my care of the patient were reviewed by me and considered in my medical decision making (see chart for details).    Nasal congestion and sore throat.  Rapid strep negative; Culture pending.  Instructed patient to take Tylenol or ibuprofen as needed for discomfort and Mucinex as needed for nasal congestion.  Patient refused COVID test today; discussed with her that she could should consider this if her symptoms persist or worsen.  Instructed patient to return here if she has worsening symptoms or develops fever, chills, rash, cough, shortness of  breath, or other concerns.  Patient agrees to plan of  care.     Final Clinical Impressions(s) / UC Diagnoses   Final diagnoses:  Nasal congestion  Sore throat     Discharge Instructions     Your rapid strep test was negative.  A throat culture is pending and we will call you if it is positive.   Take Tylenol or ibuprofen as needed for discomfort.  Take Mucinex as needed for nasal congestion.    Consider a COVID test if your symptoms persist or worsen.    Return here if you have worsening symptoms or develop fever, chills, rash, cough, shortness of breath, or other concerns.        ED Prescriptions    None     PDMP not reviewed this encounter.   Mickie Bail, NP 06/28/19 [phone]

## 2019-06-29 ENCOUNTER — Telehealth (HOSPITAL_COMMUNITY): Payer: Self-pay | Admitting: Emergency Medicine

## 2019-06-29 NOTE — Telephone Encounter (Signed)
Pt called stating she woke up this morning with continued symptoms, requesting and extension for work. Pt only given day of visit off. Sent extended work note with max amount of time off to pt Mychart.

## 2019-06-30 LAB — CULTURE, GROUP A STREP (THRC)

## 2020-01-05 ENCOUNTER — Encounter (HOSPITAL_COMMUNITY): Payer: Self-pay | Admitting: Emergency Medicine

## 2020-01-05 ENCOUNTER — Other Ambulatory Visit: Payer: Self-pay

## 2020-01-05 ENCOUNTER — Ambulatory Visit (HOSPITAL_COMMUNITY)
Admission: EM | Admit: 2020-01-05 | Discharge: 2020-01-05 | Disposition: A | Discharge status: Home / Self Care | Payer: Medicaid Other | Attending: Family Medicine | Admitting: Family Medicine

## 2020-01-05 DIAGNOSIS — N760 Acute vaginitis: Secondary | ICD-10-CM | POA: Diagnosis not present

## 2020-01-05 MED ORDER — NYSTATIN-TRIAMCINOLONE 100000-0.1 UNIT/GM-% EX CREA: TOPICAL_CREAM | CUTANEOUS | 0 refills | Status: DC

## 2020-01-05 MED ORDER — FLUCONAZOLE 150 MG PO TABS: 150.0000 mg | ORAL_TABLET | Freq: Every day | ORAL | 0 refills | Status: DC

## 2020-01-05 NOTE — ED Triage Notes (Signed)
PT has vaginal discharge and itching for 3 days. History of BV and yeast infections.

## 2020-01-05 NOTE — Discharge Instructions (Signed)
Treating you for a yeast infection Sending swab for testing.  Follow up as needed for continued or worsening symptoms

## 2020-01-05 NOTE — ED Provider Notes (Signed)
[facility]    CSN: [id number] Arrival date & time: 01/05/20  0844      History   Chief Complaint Chief Complaint  Patient presents with  . Vaginal Discharge    HPI Sydney Allison is a 29 y.o. female.   Patient is a 29 year old female presents today for vaginal discharge, itching and irritation.  This is been present for the last 3 days.  Describes the discharge as thick, white and clumpy.  No specific odor.  She has not taken anything over-the-counter for her symptoms.  Denies any concern for STDs.  Denies any dysuria, hematuria or urinary frequency.  No abdominal pain, back pain, nausea or vomiting.  Currently has Nexplanon for birth control. Hx of yeast infections.   ROS per HPI      Past Medical History:  Diagnosis Date  . WCBJSEGB(151.7)     Patient Active Problem List   Diagnosis Date Noted  . Normal pregnancy 10/18/2017  . Postpartum state 10/18/2017  . Mood disorder (HCC) 04/17/2013  . PTSD (post-traumatic stress disorder) 04/17/2013    Past Surgical History:  Procedure Laterality Date  . NO PAST SURGERIES      OB History    Gravida  2   Para  2   Term  2   Preterm      AB      Living  2     SAB      TAB      Ectopic      Multiple  0   Live Births  2            Home Medications    Prior to Admission medications   Medication Sig Start Date End Date Taking? Authorizing Provider  etonogestrel (NEXPLANON) 68 MG IMPL implant 1 each by Subdermal route once.   Yes [provider]  fluconazole (DIFLUCAN) 150 MG tablet Take 1 tablet (150 mg total) by mouth daily. 01/05/20   Dahlia Byes A, NP  guaiFENesin (MUCINEX) 600 MG 12 hr tablet Take by mouth 2 (two) times daily.    [provider]  ibuprofen (ADVIL,MOTRIN) 800 MG tablet Take 1 tablet (800 mg total) by mouth 3 (three) times daily. Patient not taking: Reported on 04/07/2018 10/20/17   Levi Aland, MD  nystatin-triamcinolone [facility] II) cream  Apply to affected area daily 01/05/20   Janace Aris, NP  Prenatal Vit-Fe Fumarate-FA (PRENATAL MULTIVITAMIN) TABS tablet Take 1 tablet by mouth daily at 12 noon.    [provider]    Family History Family History  Problem Relation Age of Onset  . Cancer Mother   . Depression Mother   . Hypertension Mother   . Alcohol abuse Father   . Miscarriages / Stillbirths Sister   . Diabetes Maternal Aunt   . Diabetes Maternal Uncle   . Obesity Maternal Uncle   . Diabetes Paternal Aunt   . Diabetes Paternal Uncle     Social History Social History   Tobacco Use  . Smoking status: Never Smoker  . Smokeless tobacco: Never Used  Substance Use Topics  . Alcohol use: No  . Drug use: No     Allergies   Latex and Bee venom   Review of Systems Review of Systems   Physical Exam Triage Vital Signs ED Triage Vitals  Enc Vitals Group     BP 01/05/20 0854 115/81     Pulse Rate 01/05/20 0854 86     Resp 01/05/20 0854  16     Temp 01/05/20 0854 98.6 F (37 C)     Temp Source 01/05/20 0854 Oral     SpO2 01/05/20 0854 98 %     Weight --      Height --      Head Circumference --      Peak Flow --      Pain Score 01/05/20 0853 8     Pain Loc --      Pain Edu? --      Excl. in [place]? --    No data found.  Updated Vital Signs BP 115/81   Pulse 86   Temp 98.6 F (37 C) (Oral)   Resp 16   LMP 11/07/2019   SpO2 98%   Visual Acuity Right Eye Distance:   Left Eye Distance:   Bilateral Distance:    Right Eye Near:   Left Eye Near:    Bilateral Near:     Physical Exam Vitals and nursing note reviewed.  Constitutional:      General: She is not in acute distress.    Appearance: Normal appearance. She is not ill-appearing, toxic-appearing or diaphoretic.  HENT:     Head: Normocephalic.     Nose: Nose normal.  Eyes:     Conjunctiva/sclera: Conjunctivae normal.  Pulmonary:     Effort: Pulmonary effort is normal.  Musculoskeletal:        General: Normal range of  motion.     Cervical back: Normal range of motion.  Skin:    General: Skin is warm and dry.     Findings: No rash.  Neurological:     Mental Status: She is alert.  Psychiatric:        Mood and Affect: Mood normal.      UC Treatments / Results  Labs (all labs ordered are listed, but only abnormal results are displayed) Labs Reviewed  CERVICOVAGINAL ANCILLARY ONLY    EKG   Radiology No results found.  Procedures Procedures (including critical care time)  Medications Ordered in UC Medications - No data to display  Initial Impression / Assessment and Plan / UC Course  I have reviewed the triage vital signs and the nursing notes.  Pertinent labs & imaging results that were available during my care of the patient were reviewed by me and considered in my medical decision making (see chart for details).     Acute vaginitis.  Most likely yeast infection based on symptoms and history. Will treat for yeast infection and send swab for further testing Final Clinical Impressions(s) / UC Diagnoses   Final diagnoses:  Acute vaginitis     Discharge Instructions     Treating you for a yeast infection Sending swab for testing.  Follow up as needed for continued or worsening symptoms     ED Prescriptions    Medication Sig Dispense Auth. Provider   fluconazole (DIFLUCAN) 150 MG tablet Take 1 tablet (150 mg total) by mouth daily. 2 tablet Chelan Heringer A, NP   nystatin-triamcinolone (MYCOLOG II) cream Apply to affected area daily 15 g Adesuwa Osgood A, NP     PDMP not reviewed this encounter.   Orvan July, NP 01/05/20 [phone]

## 2020-01-06 ENCOUNTER — Telehealth (HOSPITAL_COMMUNITY): Payer: Self-pay

## 2020-01-06 LAB — CERVICOVAGINAL ANCILLARY ONLY
Bacterial Vaginitis (gardnerella): POSITIVE — AB
Candida Glabrata: NEGATIVE
Candida Vaginitis: POSITIVE — AB
Chlamydia: NEGATIVE
Comment: NEGATIVE
Comment: NEGATIVE
Comment: NEGATIVE
Comment: NEGATIVE
Comment: NEGATIVE
Comment: NORMAL
Neisseria Gonorrhea: NEGATIVE
Trichomonas: NEGATIVE

## 2020-01-06 MED ORDER — METRONIDAZOLE 500 MG PO TABS: 500.0000 mg | ORAL_TABLET | Freq: Two times a day (BID) | ORAL | 0 refills | Status: DC

## 2020-01-06 MED ORDER — NYSTATIN-TRIAMCINOLONE 100000-0.1 UNIT/GM-% EX CREA: TOPICAL_CREAM | CUTANEOUS | 0 refills | Status: DC

## 2020-01-09 ENCOUNTER — Encounter (HOSPITAL_COMMUNITY): Payer: Self-pay

## 2020-01-09 ENCOUNTER — Other Ambulatory Visit: Payer: Self-pay

## 2020-01-09 ENCOUNTER — Ambulatory Visit (HOSPITAL_COMMUNITY)
Admission: EM | Admit: 2020-01-09 | Discharge: 2020-01-09 | Disposition: A | Discharge status: Home / Self Care | Payer: Medicaid Other

## 2020-01-09 DIAGNOSIS — N898 Other specified noninflammatory disorders of vagina: Secondary | ICD-10-CM | POA: Diagnosis not present

## 2020-01-09 NOTE — ED Provider Notes (Signed)
[facility]    CSN: [id number] Arrival date & time: 01/09/20  1123      History   Chief Complaint Chief Complaint  Patient presents with  . vaginal d/c, irritation    HPI Sydney Allison is a 29 y.o. female.   Patient is a 29 year old female presents today with continued itching, irritation of vaginal area.  She was seen here recently and treated for bacterial vaginosis and yeast infection.  She has been taking the medication as prescribed.  She has finished the Diflucan.  Reporting still having some vaginal swelling, itching.  She is also still using the cream.  No fever, chills, abdominal pain, dysuria, hematuria or urinary frequency.  ROS per HPI      Past Medical History:  Diagnosis Date  . TFTDDUKG(254.2)     Patient Active Problem List   Diagnosis Date Noted  . Normal pregnancy 10/18/2017  . Postpartum state 10/18/2017  . Mood disorder ([place]) 04/17/2013  . PTSD (post-traumatic stress disorder) 04/17/2013    Past Surgical History:  Procedure Laterality Date  . NO PAST SURGERIES      OB History    Gravida  2   Para  2   Term  2   Preterm      AB      Living  2     SAB      TAB      Ectopic      Multiple  0   Live Births  2            Home Medications    Prior to Admission medications   Medication Sig Start Date End Date Taking? Authorizing Provider  etonogestrel (NEXPLANON) 68 MG IMPL implant 1 each by Subdermal route once.    [provider]  fluconazole (DIFLUCAN) 150 MG tablet Take 1 tablet (150 mg total) by mouth daily. 01/05/20   Loura Halt A, NP  ibuprofen (ADVIL,MOTRIN) 800 MG tablet Take 1 tablet (800 mg total) by mouth 3 (three) times daily. Patient not taking: Reported on 04/07/2018 10/20/17   Olga Millers, MD  metroNIDAZOLE (FLAGYL) 500 MG tablet Take 1 tablet (500 mg total) by mouth 2 (two) times daily. 01/06/20   Chase Picket, MD  nystatin-triamcinolone (MYCOLOG II) cream Apply to affected  area daily 01/06/20   Lamptey, Myrene Galas, MD    Family History Family History  Problem Relation Age of Onset  . Cancer Mother   . Depression Mother   . Hypertension Mother   . Alcohol abuse Father   . Miscarriages / Stillbirths Sister   . Diabetes Maternal Aunt   . Diabetes Maternal Uncle   . Obesity Maternal Uncle   . Diabetes Paternal Aunt   . Diabetes Paternal Uncle     Social History Social History   Tobacco Use  . Smoking status: Never Smoker  . Smokeless tobacco: Never Used  Substance Use Topics  . Alcohol use: No  . Drug use: No     Allergies   Latex and Bee venom   Review of Systems Review of Systems   Physical Exam Triage Vital Signs ED Triage Vitals [01/09/20 1242]  Enc Vitals Group     BP 123/76     Pulse Rate 90     Resp 16     Temp 98.8 F (37.1 C)     Temp Source Oral     SpO2 100 %     Weight 155 lb (70.3 kg)  Height 5\' 1"  (1.549 m)     Head Circumference      Peak Flow      Pain Score 7     Pain Loc      Pain Edu?      Excl. in GC?    No data found.  Updated Vital Signs BP 123/76   Pulse 90   Temp 98.8 F (37.1 C) (Oral)   Resp 16   Ht 5\' 1"  (1.549 m)   Wt 155 lb (70.3 kg)   SpO2 100%   BMI 29.29 kg/m   Visual Acuity Right Eye Distance:   Left Eye Distance:   Bilateral Distance:    Right Eye Near:   Left Eye Near:    Bilateral Near:     Physical Exam Vitals and nursing note reviewed.  Constitutional:      General: She is not in acute distress.    Appearance: Normal appearance. She is not ill-appearing, toxic-appearing or diaphoretic.  HENT:     Head: Normocephalic.     Nose: Nose normal.  Eyes:     Conjunctiva/sclera: Conjunctivae normal.  Pulmonary:     Effort: Pulmonary effort is normal.  Musculoskeletal:        General: Normal range of motion.     Cervical back: Normal range of motion.  Skin:    General: Skin is warm and dry.     Findings: No rash.  Neurological:     Mental Status: She is alert.   Psychiatric:        Mood and Affect: Mood normal.      UC Treatments / Results  Labs (all labs ordered are listed, but only abnormal results are displayed) Labs Reviewed - No data to display  EKG   Radiology No results found.  Procedures Procedures (including critical care time)  Medications Ordered in UC Medications - No data to display  Initial Impression / Assessment and Plan / UC Course  I have reviewed the triage vital signs and the nursing notes.  Pertinent labs & imaging results that were available during my care of the patient were reviewed by me and considered in my medical decision making (see chart for details).     Vaginal irritation-recommended baking soda baths for itching, irritation and swelling Continue taking the metronidazole for BV Keep using the cream for yeast as needed. Follow up as needed for continued or worsening symptoms Wok not given  TakingFinal Clinical Impressions(s) / UC Diagnoses   Final diagnoses:  Vaginal irritation     Discharge Instructions     Try doing baking soda bath Keep taking the medication Follow up as needed for continued or worsening symptoms     ED Prescriptions    None     PDMP not reviewed this encounter.   , NP 01/09/20 1311

## 2020-01-09 NOTE — ED Triage Notes (Signed)
Pt was here Thurs for vaginal issues and was told she has BV and yeast inf. And was given meds, but meds not helping. Pt still having white vaginal discharge, vaginal itching and vagina is swollen.

## 2020-01-09 NOTE — Discharge Instructions (Addendum)
Try doing baking soda bath Keep taking the medication Follow up as needed for continued or worsening symptoms

## 2020-02-07 ENCOUNTER — Ambulatory Visit (HOSPITAL_COMMUNITY)
Admission: EM | Admit: 2020-02-07 | Discharge: 2020-02-07 | Disposition: A | Discharge status: Home / Self Care | Payer: Medicaid Other | Attending: Emergency Medicine | Admitting: Emergency Medicine

## 2020-02-07 ENCOUNTER — Encounter (HOSPITAL_COMMUNITY): Payer: Self-pay

## 2020-02-07 ENCOUNTER — Other Ambulatory Visit: Payer: Self-pay

## 2020-02-07 DIAGNOSIS — M25571 Pain in right ankle and joints of right foot: Secondary | ICD-10-CM | POA: Diagnosis not present

## 2020-02-07 MED ORDER — IBUPROFEN 800 MG PO TABS: 800.0000 mg | ORAL_TABLET | Freq: Three times a day (TID) | ORAL | 0 refills | Status: DC

## 2020-02-07 MED ORDER — PREDNISONE 10 MG (21) PO TBPK: ORAL_TABLET | ORAL | 0 refills | Status: DC

## 2020-02-07 NOTE — ED Provider Notes (Signed)
[facility]    CSN: [id number] Arrival date & time: 02/07/20  1453      History   Chief Complaint Chief Complaint  Patient presents with  . Ankle Pain    HPI Sydney Allison is a 29 y.o. female.   Who presented to the urgent care with a complaint of right ankle pain and swelling for the past 1 week.  Denies any precipitating event.  Localized pain to the right ankle.  Described the pain as constant achy, rated at 6 on a scale 1-10.  Has tried OTC Tylenol/ibuprofen without relief.  Her symptoms are made worse with range of motion.  Denies similar symptoms in the past.  Denies chills, fever, nausea, vomiting, trauma, injury  The history is provided by the patient. No language interpreter was used.  Ankle Pain   Past Medical History:  Diagnosis Date  . WNIOEVOJ(500.9)     Patient Active Problem List   Diagnosis Date Noted  . Normal pregnancy 10/18/2017  . Postpartum state 10/18/2017  . Mood disorder ([facility]) 04/17/2013  . PTSD (post-traumatic stress disorder) 04/17/2013    Past Surgical History:  Procedure Laterality Date  . NO PAST SURGERIES      OB History    Gravida  2   Para  2   Term  2   Preterm      AB      Living  2     SAB      TAB      Ectopic      Multiple  0   Live Births  2            Home Medications    Prior to Admission medications   Medication Sig Start Date End Date Taking? Authorizing Provider  etonogestrel (NEXPLANON) 68 MG IMPL implant 1 each by Subdermal route once.    [provider]  fluconazole (DIFLUCAN) 150 MG tablet Take 1 tablet (150 mg total) by mouth daily. 01/05/20   Dahlia Byes A, NP  ibuprofen (ADVIL) 800 MG tablet Take 1 tablet (800 mg total) by mouth 3 (three) times daily. 02/07/20   Maleni Seyer, Zachery Dakins, FNP  metroNIDAZOLE (FLAGYL) 500 MG tablet Take 1 tablet (500 mg total) by mouth 2 (two) times daily. 01/06/20   Lamptey, Britta Mccreedy, MD  nystatin-triamcinolone (MYCOLOG II) cream Apply to  affected area daily 01/06/20   Lamptey, Britta Mccreedy, MD  predniSONE (STERAPRED UNI-PAK 21 TAB) 10 MG (21) TBPK tablet Take 6 tabs by mouth daily  for 2 days, then 5 tabs for 2 days, then 4 tabs for 2 days, then 3 tabs for 2 days, 2 tabs for 2 days, then 1 tab by mouth daily for 2 days 02/07/20   Durward Parcel, FNP    Family History Family History  Problem Relation Age of Onset  . Cancer Mother   . Depression Mother   . Hypertension Mother   . Alcohol abuse Father   . Miscarriages / Stillbirths Sister   . Diabetes Maternal Aunt   . Diabetes Maternal Uncle   . Obesity Maternal Uncle   . Diabetes Paternal Aunt   . Diabetes Paternal Uncle     Social History Social History   Tobacco Use  . Smoking status: Never Smoker  . Smokeless tobacco: Never Used  Substance Use Topics  . Alcohol use: No  . Drug use: No     Allergies   Latex and Bee venom   Review of Systems  Review of Systems  Constitutional: Negative.   Respiratory: Negative.   Cardiovascular: Negative.   Musculoskeletal: Positive for arthralgias.  All other systems reviewed and are negative.    Physical Exam Triage Vital Signs ED Triage Vitals  Enc Vitals Group     BP 02/07/20 1509 127/62     Pulse Rate 02/07/20 1509 68     Resp 02/07/20 1509 18     Temp 02/07/20 1509 98.4 F (36.9 C)     Temp Source 02/07/20 1509 Oral     SpO2 02/07/20 1509 99 %     Weight --      Height --      Head Circumference --      Peak Flow --      Pain Score 02/07/20 1508 6     Pain Loc --      Pain Edu? --      Excl. in GC? --    No data found.  Updated Vital Signs BP 127/62 (BP Location: Right Arm)   Pulse 68   Temp 98.4 F (36.9 C) (Oral)   Resp 18   LMP 02/04/2020   SpO2 99%   Visual Acuity Right Eye Distance:   Left Eye Distance:   Bilateral Distance:    Right Eye Near:   Left Eye Near:    Bilateral Near:     Physical Exam Vitals and nursing note reviewed.  Constitutional:      General: She is not in  acute distress.    Appearance: Normal appearance. She is normal weight. She is not ill-appearing, toxic-appearing or diaphoretic.  Cardiovascular:     Rate and Rhythm: Normal rate and regular rhythm.     Pulses: Normal pulses.     Heart sounds: Normal heart sounds. No murmur. No gallop.   Pulmonary:     Effort: Pulmonary effort is normal. No respiratory distress.     Breath sounds: Normal breath sounds. No stridor. No wheezing, rhonchi or rales.  Chest:     Chest wall: No tenderness.  Musculoskeletal:        General: Swelling and tenderness present.     Right ankle: Swelling present. No lacerations. Tenderness present.     Comments: The right ankle is without deformity when compared to the left ankle.  No surface trauma, ecchymosis, crepitus.  Warmth and swelling present.  Flex/extend, invert/evert with pain.  Neurovascular status intact  Neurological:     Mental Status: She is alert.     Sensory: No sensory deficit.      UC Treatments / Results  Labs (all labs ordered are listed, but only abnormal results are displayed) Labs Reviewed - No data to display  EKG   Radiology No results found.  Procedures Procedures (including critical care time)  Medications Ordered in UC Medications - No data to display  Initial Impression / Assessment and Plan / UC Course  I have reviewed the triage vital signs and the nursing notes.  Pertinent labs & imaging results that were available during my care of the patient were reviewed by me and considered in my medical decision making (see chart for details).   Patient is stable at discharge.  There is a concern about gout.  Reviewing the eating plan resource was given.  Prednisone ibuprofen was prescribed.   Final Clinical Impressions(s) / UC Diagnoses   Final diagnoses:  Acute right ankle pain     Discharge Instructions     Prescribed prednisone take as directed and to  completion Prescribed ibuprofen  take as directed Follow up  with PCP for further evaluation and management Return or go to the ED if you have any new or worsening symptoms     ED Prescriptions    Medication Sig Dispense Auth. Provider   predniSONE (STERAPRED UNI-PAK 21 TAB) 10 MG (21) TBPK tablet Take 6 tabs by mouth daily  for 2 days, then 5 tabs for 2 days, then 4 tabs for 2 days, then 3 tabs for 2 days, 2 tabs for 2 days, then 1 tab by mouth daily for 2 days 42 tablet Daisy Lites S, FNP   ibuprofen (ADVIL) 800 MG tablet Take 1 tablet (800 mg total) by mouth 3 (three) times daily. 21 tablet Tonique Mendonca, Darrelyn Hillock, FNP     PDMP not reviewed this encounter.   Emerson Monte, [place] 02/07/20 1544

## 2020-02-07 NOTE — Discharge Instructions (Addendum)
Prescribed prednisone take as directed and to completion Prescribed ibuprofen  take as directed Follow up with PCP for further evaluation and management Return or go to the ED if you have any new or worsening symptoms

## 2020-02-07 NOTE — ED Triage Notes (Signed)
Pt presents with right ankle pain and swelling not injury related X 1 week; no  Relief with ice and elevation.

## 2020-07-16 ENCOUNTER — Ambulatory Visit (HOSPITAL_COMMUNITY)
Admission: EM | Admit: 2020-07-16 | Discharge: 2020-07-16 | Disposition: A | Discharge status: Home / Self Care | Payer: Medicaid Other | Attending: Internal Medicine | Admitting: Internal Medicine

## 2020-07-16 DIAGNOSIS — J069 Acute upper respiratory infection, unspecified: Secondary | ICD-10-CM | POA: Diagnosis not present

## 2020-07-16 DIAGNOSIS — J029 Acute pharyngitis, unspecified: Secondary | ICD-10-CM | POA: Insufficient documentation

## 2020-07-16 DIAGNOSIS — R059 Cough, unspecified: Secondary | ICD-10-CM | POA: Diagnosis present

## 2020-07-16 DIAGNOSIS — Z20822 Contact with and (suspected) exposure to covid-19: Secondary | ICD-10-CM | POA: Insufficient documentation

## 2020-07-16 NOTE — ED Triage Notes (Signed)
Pt c/o sore throat, runny nose with nasal congestion and headache onset yesterday. Pt denies fever.

## 2020-07-17 LAB — SARS CORONAVIRUS 2 (TAT 6-24 HRS): SARS Coronavirus 2: NEGATIVE

## 2020-07-17 NOTE — ED Provider Notes (Signed)
[facility]    CSN: [id number] Arrival date & time: 07/16/20  1141      History   Chief Complaint Chief Complaint  Patient presents with  . Cough  . Sore Throat  . Headache    HPI Sydney Allison is a 29 y.o. female who is not vaccinated against COVID-19 virus comes to the urgent care with complaints of nonproductive cough, sore throat and a headache which started yesterday.  Patient denies any fever or generalized body aches.  No sick contacts.  No shortness of breath or wheezing.  No exposure to COVID-19 virus.  No nausea, vomiting or diarrhea.  No loss of taste or smell.Marland Kitchen   HPI  Past Medical History:  Diagnosis Date  . IEPPIRJJ(884.1)     Patient Active Problem List   Diagnosis Date Noted  . Normal pregnancy 10/18/2017  . Postpartum state 10/18/2017  . Mood disorder ([facility]) 04/17/2013  . PTSD (post-traumatic stress disorder) 04/17/2013    Past Surgical History:  Procedure Laterality Date  . NO PAST SURGERIES      OB History    Gravida  2   Para  2   Term  2   Preterm      AB      Living  2     SAB      TAB      Ectopic      Multiple  0   Live Births  2            Home Medications    Prior to Admission medications   Medication Sig Start Date End Date Taking? Authorizing Provider  etonogestrel (NEXPLANON) 68 MG IMPL implant 1 each by Subdermal route once.    [provider]    Family History Family History  Problem Relation Age of Onset  . Cancer Mother   . Depression Mother   . Hypertension Mother   . Alcohol abuse Father   . Miscarriages / Stillbirths Sister   . Diabetes Maternal Aunt   . Diabetes Maternal Uncle   . Obesity Maternal Uncle   . Diabetes Paternal Aunt   . Diabetes Paternal Uncle     Social History Social History   Tobacco Use  . Smoking status: Never Smoker  . Smokeless tobacco: Never Used  Vaping Use  . Vaping Use: Never used  Substance Use Topics  . Alcohol use: No  .  Drug use: No     Allergies   Latex and Bee venom   Review of Systems Review of Systems as per HPI   Physical Exam Triage Vital Signs ED Triage Vitals  Enc Vitals Group     BP 07/16/20 1450 121/75     Pulse Rate 07/16/20 1450 85     Resp 07/16/20 1450 16     Temp 07/16/20 1450 98.8 F (37.1 C)     Temp Source 07/16/20 1450 Oral     SpO2 07/16/20 1450 99 %     Weight --      Height --      Head Circumference --      Peak Flow --      Pain Score 07/16/20 1452 6     Pain Loc --      Pain Edu? --      Excl. in GC? --    No data found.  Updated Vital Signs BP 121/75 (BP Location: Left Arm)   Pulse 85   Temp 98.8 F (37.1 C) (  Oral)   Resp 16   LMP 07/06/2020   SpO2 99%   Visual Acuity Right Eye Distance:   Left Eye Distance:   Bilateral Distance:    Right Eye Near:   Left Eye Near:    Bilateral Near:     Physical Exam Vitals and nursing note reviewed.  Constitutional:      General: She is not in acute distress.    Appearance: She is not ill-appearing.  HENT:     Right Ear: Tympanic membrane normal.     Left Ear: Tympanic membrane normal.     Mouth/Throat:     Mouth: Mucous membranes are moist.     Pharynx: No posterior oropharyngeal erythema.     Tonsils: No tonsillar exudate or tonsillar abscesses. 0 on the right. 0 on the left.  Cardiovascular:     Rate and Rhythm: Normal rate and regular rhythm.  Pulmonary:     Effort: Pulmonary effort is normal.     Breath sounds: Normal breath sounds.  Neurological:     Mental Status: She is alert.      UC Treatments / Results  Labs (all labs ordered are listed, but only abnormal results are displayed) Labs Reviewed  SARS CORONAVIRUS 2 (TAT 6-24 HRS)    EKG   Radiology No results found.  Procedures Procedures (including critical care time)  Medications Ordered in UC Medications - No data to display  Initial Impression / Assessment and Plan / UC Course  I have reviewed the triage vital signs  and the nursing notes.  Pertinent labs & imaging results that were available during my care of the patient were reviewed by me and considered in my medical decision making (see chart for details).     1.  Viral URI with cough: COVID-19 PCR test has been sent Please quarantine until COVID-19 test results are available Tylenol as needed for pain Return precautions given  Final Clinical Impressions(s) / UC Diagnoses   Final diagnoses:  Viral URI with cough   Discharge Instructions   None    ED Prescriptions    None     PDMP not reviewed this encounter.   Merrilee Jansky, MD 07/17/20 2245

## 2021-02-25 ENCOUNTER — Ambulatory Visit (HOSPITAL_COMMUNITY)
Admission: RE | Admit: 2021-02-25 | Discharge: 2021-02-25 | Disposition: A | Discharge status: Home / Self Care | Payer: Medicaid Other | Source: Ambulatory Visit

## 2021-02-25 ENCOUNTER — Other Ambulatory Visit: Payer: Self-pay

## 2021-02-25 ENCOUNTER — Encounter (HOSPITAL_COMMUNITY): Payer: Self-pay

## 2021-02-25 VITALS — BP 130/76 | HR 60 | Temp 98.3°F | Resp 16

## 2021-02-25 DIAGNOSIS — R197 Diarrhea, unspecified: Secondary | ICD-10-CM | POA: Diagnosis not present

## 2021-02-25 DIAGNOSIS — K219 Gastro-esophageal reflux disease without esophagitis: Secondary | ICD-10-CM | POA: Diagnosis not present

## 2021-02-25 MED ORDER — ESOMEPRAZOLE MAGNESIUM 40 MG PO CPDR: 40.0000 mg | DELAYED_RELEASE_CAPSULE | Freq: Every day | ORAL | 0 refills | Status: DC

## 2021-02-25 NOTE — ED Provider Notes (Signed)
[facility]    CSN: [id number] Arrival date & time: 02/25/21  1841      History   Chief Complaint Chief Complaint  Patient presents with  . Heartburn  . Diarrhea    HPI Sydney Allison is a 30 y.o. female.   31 year old female comes in for 2-day history of GERD like symptoms.  Describes burning sensation from the base of throat to epigastric region.  Also noticed some globus sensation.  Denies epigastric pain.  Nausea last night without vomiting.  Has had 4 episodes of loose but formed stools.  Denies fever, URI symptoms.  Denies hematochezia, melena.  Denies chronic NSAID use, new medications, smoking, alcohol use, caffeine use.  Denies drug use.  Has tried Pepto-Bismol, Tums with minimal relief.     Past Medical History:  Diagnosis Date  . IOXBDZHG(992.4)     Patient Active Problem List   Diagnosis Date Noted  . Normal pregnancy 10/18/2017  . Postpartum state 10/18/2017  . Mood disorder ([facility]) 04/17/2013  . PTSD (post-traumatic stress disorder) 04/17/2013    Past Surgical History:  Procedure Laterality Date  . NO PAST SURGERIES      OB History    Gravida  2   Para  2   Term  2   Preterm      AB      Living  2     SAB      IAB      Ectopic      Multiple  0   Live Births  2            Home Medications    Prior to Admission medications   Medication Sig Start Date End Date Taking? Authorizing Provider  esomeprazole (NEXIUM) 40 MG capsule Take 1 capsule (40 mg total) by mouth daily for 14 days. 02/25/21 03/11/21 Yes Geniya Fulgham V, PA-C  Levonorgestrel-Ethinyl Estradiol (SIMPESSE) 0.15-0.03 &0.01 MG tablet Take 1 tablet by mouth daily.   Yes [provider]    Family History Family History  Problem Relation Age of Onset  . Cancer Mother   . Depression Mother   . Hypertension Mother   . Alcohol abuse Father   . Miscarriages / Stillbirths Sister   . Diabetes Maternal Aunt   . Diabetes Maternal Uncle   . Obesity  Maternal Uncle   . Diabetes Paternal Aunt   . Diabetes Paternal Uncle     Social History Social History   Tobacco Use  . Smoking status: Never Smoker  . Smokeless tobacco: Never Used  Vaping Use  . Vaping Use: Never used  Substance Use Topics  . Alcohol use: No  . Drug use: No     Allergies   Latex and Bee venom   Review of Systems Review of Systems  Reason unable to perform ROS: See HPI as above.     Physical Exam Triage Vital Signs ED Triage Vitals  Enc Vitals Group     BP 02/25/21 1903 130/76     Pulse Rate 02/25/21 1903 60     Resp 02/25/21 1903 16     Temp 02/25/21 1903 98.3 F (36.8 C)     Temp Source 02/25/21 1903 Oral     SpO2 02/25/21 1903 100 %     Weight --      Height --      Head Circumference --      Peak Flow --      Pain Score 02/25/21 1902 8  Pain Loc --      Pain Edu? --      Excl. in GC? --    No data found.  Updated Vital Signs BP 130/76 (BP Location: Right Arm)   Pulse 60   Temp 98.3 F (36.8 C) (Oral)   Resp 16   LMP 02/02/2021   SpO2 100%   Physical Exam Constitutional:      General: She is not in acute distress.    Appearance: She is well-developed. She is not ill-appearing, toxic-appearing or diaphoretic.  HENT:     Head: Normocephalic and atraumatic.  Eyes:     Conjunctiva/sclera: Conjunctivae normal.     Pupils: Pupils are equal, round, and reactive to light.  Cardiovascular:     Rate and Rhythm: Normal rate and regular rhythm.  Pulmonary:     Effort: Pulmonary effort is normal. No respiratory distress.     Comments: LCTAB Abdominal:     General: Bowel sounds are normal.     Palpations: Abdomen is soft.     Tenderness: There is no abdominal tenderness. There is no right CVA tenderness, left CVA tenderness, guarding or rebound.  Musculoskeletal:     Cervical back: Normal range of motion and neck supple.  Skin:    General: Skin is warm and dry.  Neurological:     Mental Status: She is alert and oriented to  person, place, and time.  Psychiatric:        Behavior: Behavior normal.        Judgment: Judgment normal.      UC Treatments / Results  Labs (all labs ordered are listed, but only abnormal results are displayed) Labs Reviewed - No data to display  EKG   Radiology No results found.  Procedures Procedures (including critical care time)  Medications Ordered in UC Medications - No data to display  Initial Impression / Assessment and Plan / UC Course  I have reviewed the triage vital signs and the nursing notes.  Pertinent labs & imaging results that were available during my care of the patient were reviewed by me and considered in my medical decision making (see chart for details).    Offered GI cocktail, patient deferred at this time.  Will start PPI at this time.  Bland diet, advance as tolerated.  Return precautions given.  Patient expresses understanding and agrees to plan.  Final Clinical Impressions(s) / UC Diagnoses   Final diagnoses:  Gastroesophageal reflux disease without esophagitis  Diarrhea, unspecified type    ED Prescriptions    Medication Sig Dispense Auth. Provider   esomeprazole (NEXIUM) 40 MG capsule Take 1 capsule (40 mg total) by mouth daily for 14 days. 14 capsule Belinda Fisher, PA-C     PDMP not reviewed this encounter.   Belinda Fisher, PA-C 02/25/21 1942

## 2021-02-25 NOTE — Discharge Instructions (Signed)
Start Nexium for 7-14 days as directed You can use over the counter peptobismol as needed Keep hydrated, urine should be clear to pale yellow in color.   Follow up with PCP if symptoms not improving. If having abdominal pain, vomiting, fever, follow up for reevaluation.

## 2021-02-25 NOTE — ED Triage Notes (Signed)
Pt present heartburn with diarrhea, symptoms started yesterday. Pt states that her throat and stomach in the inside is burning.  Pt tried OTC medication with no relief.

## 2021-12-23 ENCOUNTER — Encounter (HOSPITAL_COMMUNITY): Payer: Self-pay | Admitting: *Deleted

## 2021-12-23 ENCOUNTER — Inpatient Hospital Stay (HOSPITAL_COMMUNITY)
Admission: AD | Admit: 2021-12-23 | Discharge: 2021-12-23 | Disposition: A | Discharge status: Home / Self Care | Payer: Medicaid Other | Attending: Obstetrics & Gynecology | Admitting: Obstetrics & Gynecology

## 2021-12-23 ENCOUNTER — Inpatient Hospital Stay (HOSPITAL_COMMUNITY): Payer: Medicaid Other

## 2021-12-23 DIAGNOSIS — O209 Hemorrhage in early pregnancy, unspecified: Secondary | ICD-10-CM | POA: Diagnosis present

## 2021-12-23 DIAGNOSIS — Z3A01 Less than 8 weeks gestation of pregnancy: Secondary | ICD-10-CM | POA: Insufficient documentation

## 2021-12-23 DIAGNOSIS — O3680X0 Pregnancy with inconclusive fetal viability, not applicable or unspecified: Secondary | ICD-10-CM

## 2021-12-23 LAB — URINALYSIS, ROUTINE W REFLEX MICROSCOPIC
Bilirubin Urine: NEGATIVE
Glucose, UA: NEGATIVE mg/dL
Hgb urine dipstick: NEGATIVE
Ketones, ur: NEGATIVE mg/dL
Leukocytes,Ua: NEGATIVE
Nitrite: NEGATIVE
Protein, ur: NEGATIVE mg/dL
Specific Gravity, Urine: 1.027 (ref 1.005–1.030)
pH: 5 (ref 5.0–8.0)

## 2021-12-23 LAB — CBC
HCT: 40.7 % (ref 36.0–46.0)
Hemoglobin: 14.2 g/dL (ref 12.0–15.0)
MCH: 32.2 pg (ref 26.0–34.0)
MCHC: 34.9 g/dL (ref 30.0–36.0)
MCV: 92.3 fL (ref 80.0–100.0)
Platelets: 173 10*3/uL (ref 150–400)
RBC: 4.41 MIL/uL (ref 3.87–5.11)
RDW: 11.9 % (ref 11.5–15.5)
WBC: 10.9 10*3/uL — ABNORMAL HIGH (ref 4.0–10.5)
nRBC: 0 % (ref 0.0–0.2)

## 2021-12-23 LAB — WET PREP, GENITAL
Sperm: NONE SEEN
Trich, Wet Prep: NONE SEEN
WBC, Wet Prep HPF POC: <10 (ref <10)
Yeast Wet Prep HPF POC: NONE SEEN

## 2021-12-23 LAB — POCT PREGNANCY, URINE: Preg Test, Ur: POSITIVE — AB

## 2021-12-23 LAB — HCG, QUANTITATIVE, PREGNANCY: hCG, Beta Chain, Quant, S: 5279 m[IU]/mL — ABNORMAL HIGH (ref <5)

## 2021-12-23 NOTE — Discharge Instructions (Signed)
Call & cancel appointment at [facility] on Thursday if [facility] schedules an appointment for you ? ? ? ?Return to care  ?If you have heavier bleeding that soaks through more than 2 pads per hour for an hour or more ?If you bleed so much that you feel like you might pass out or you do pass out ?If you have significant abdominal pain that is not improved with Tylenol  ? ?

## 2021-12-23 NOTE — MAU Provider Note (Signed)
?History  ?  ? ?[id number] ? ?Arrival date and time: 12/23/21 1815 ?  ? ?Chief Complaint  ?Patient presents with  ? Vaginal Bleeding  ? Abdominal Pain  ? Possible Pregnancy  ? ? ? ?HPI ?Sydney Allison is a 31 y.o. at [redacted]w[redacted]d by LMP with PMHx notable for ovarian cysts, who presents for abdominal pain. Symptoms started last week. Reports abdominal tightness & cramping in her left lower quadrant. Rates pain 6/10. Last BM was 2 days ago. Has noticed pink spotting when she wipes.  ?Denies vomiting, dysuria, vaginal discharge, fever, constipation, or diarrhea.  ?Has appointment with [facility] ob later this month. Has not seen them in over 4 years.   ? ?OB History   ? ? Gravida  ?3  ? Para  ?2  ? Term  ?2  ? Preterm  ?   ? AB  ?   ? Living  ?2  ?  ? ? SAB  ?   ? IAB  ?   ? Ectopic  ?   ? Multiple  ?0  ? Live Births  ?2  ?   ?  ?  ? ? ?Past Medical History:  ?Diagnosis Date  ? Headache(784.0)   ? ? ?Past Surgical History:  ?Procedure Laterality Date  ? NO PAST SURGERIES    ? ? ?Family History  ?Problem Relation Age of Onset  ? Cancer Mother   ? Depression Mother   ? Hypertension Mother   ? Alcohol abuse Father   ? Miscarriages / Stillbirths Sister   ? Diabetes Maternal Aunt   ? Diabetes Maternal Uncle   ? Obesity Maternal Uncle   ? Diabetes Paternal Aunt   ? Diabetes Paternal Uncle   ? ? ?Allergies  ?Allergen Reactions  ? Latex Rash  ? Bee Venom Swelling  ? ? ?No current facility-administered medications on file prior to encounter.  ? ?Current Outpatient Medications on File Prior to Encounter  ?Medication Sig Dispense Refill  ? esomeprazole (NEXIUM) 40 MG capsule Take 1 capsule (40 mg total) by mouth daily for 14 days. 14 capsule 0  ? ? ? ?ROS ?Pertinent positives and negative per HPI, all others reviewed and negative ? ?Physical Exam  ? ?BP 120/68 (BP Location: Right Arm)   Pulse 89   Temp 98.2 ?F (36.8 ?C) (Oral)   Resp 18   Ht 5\' 1"  (1.549 m)   Wt 67.1 kg   LMP 11/19/2021   SpO2 98%   BMI 27.96 kg/m?   ? ?Patient Vitals for the past 24 hrs: ? BP Temp Temp src Pulse Resp SpO2 Height Weight  ?12/23/21 1852 120/68 98.2 ?F (36.8 ?C) Oral 89 18 98 % 5\' 1"  (1.549 m) 67.1 kg  ? ? ?Physical Exam ?Vitals and nursing note reviewed.  ?Constitutional:   ?   Appearance: She is well-developed.  ?HENT:  ?   Head: Normocephalic and atraumatic.  ?Eyes:  ?   General: No scleral icterus. ?   Pupils: Pupils are equal, round, and reactive to light.  ?Pulmonary:  ?   Effort: Pulmonary effort is normal. No respiratory distress.  ?Abdominal:  ?   General: Abdomen is flat.  ?   Palpations: Abdomen is soft.  ?   Tenderness: There is no abdominal tenderness. There is no guarding or rebound.  ?Skin: ?   General: Skin is warm and dry.  ?Neurological:  ?   Mental Status: She is alert.  ?  ? ?Labs ?Results for orders placed  or performed during the hospital encounter of 12/23/21 (from the past 24 hour(s))  ?Urinalysis, Routine w reflex microscopic Urine, Clean Catch     Status: Abnormal  ? Collection Time: 12/23/21  6:15 PM  ?Result Value Ref Range  ? Color, Urine YELLOW YELLOW  ? APPearance HAZY (A) CLEAR  ? Specific Gravity, Urine 1.027 1.005 - 1.030  ? pH 5.0 5.0 - 8.0  ? Glucose, UA NEGATIVE NEGATIVE mg/dL  ? Hgb urine dipstick NEGATIVE NEGATIVE  ? Bilirubin Urine NEGATIVE NEGATIVE  ? Ketones, ur NEGATIVE NEGATIVE mg/dL  ? Protein, ur NEGATIVE NEGATIVE mg/dL  ? Nitrite NEGATIVE NEGATIVE  ? Leukocytes,Ua NEGATIVE NEGATIVE  ?Pregnancy, urine POC     Status: Abnormal  ? Collection Time: 12/23/21  6:48 PM  ?Result Value Ref Range  ? Preg Test, Ur POSITIVE (A) NEGATIVE  ?Wet prep, genital     Status: Abnormal  ? Collection Time: 12/23/21  7:44 PM  ?Result Value Ref Range  ? Yeast Wet Prep HPF POC NONE SEEN NONE SEEN  ? Trich, Wet Prep NONE SEEN NONE SEEN  ? Clue Cells Wet Prep HPF POC PRESENT (A) NONE SEEN  ? WBC, Wet Prep HPF POC <10 <10  ? Sperm NONE SEEN   ?CBC     Status: Abnormal  ? Collection Time: 12/23/21  7:47 PM  ?Result Value Ref  Range  ? WBC 10.9 (H) 4.0 - 10.5 K/uL  ? RBC 4.41 3.87 - 5.11 MIL/uL  ? Hemoglobin 14.2 12.0 - 15.0 g/dL  ? HCT 40.7 36.0 - 46.0 %  ? MCV 92.3 80.0 - 100.0 fL  ? MCH 32.2 26.0 - 34.0 pg  ? MCHC 34.9 30.0 - 36.0 g/dL  ? RDW 11.9 11.5 - 15.5 %  ? Platelets 173 150 - 400 K/uL  ? nRBC 0.0 0.0 - 0.2 %  ?hCG, quantitative, pregnancy     Status: Abnormal  ? Collection Time: 12/23/21  7:47 PM  ?Result Value Ref Range  ? hCG, Beta Chain, Quant, S 5,279 (H) <5 mIU/mL  ? ? ?Imaging ?US OB LESS THAN 14 WEEKS WITH OB TRANSVAGINAL ? ?Result Date: 12/23/2021 ?CLINICAL DATA:  Pain and cramping.  Spotting. EXAM: OBSTETRIC <14 WK US AND TRANSVAGINAL OB US TECHNIQUE: Both transabdominal and transvaginal ultrasound examinations were performed for complete evaluation of the gestation as well as the maternal uterus, adnexal regions, and pelvic cul-de-sac. Transvaginal technique was performed to assess early pregnancy. COMPARISON:  None. FINDINGS: Intrauterine gestational sac: Single Yolk sac:  Not Visualized. Embryo:  Not Visualized. MSD: 5.9 mm   5 w   2 d Subchorionic hemorrhage:  None visualized. Maternal uterus/adnexae: Corpus luteum noted in the right ovary. Left ovary within normal limits. No pelvic free fluid. IMPRESSION: 1. Probable early intrauterine gestational sac, but no yolk sac, fetal pole, or cardiac activity yet visualized. Recommend follow-up quantitative B-HCG levels and follow-up US in 14 days to assess viability. This recommendation follows SRU consensus guidelines: Diagnostic Criteria for Nonviable Pregnancy Early in the First Trimester. Malva Limes Engl J Med 2013; 161:0960-45; 369:1443-51. Electronically Signed   By: Darliss CheneyAmy  Guttmann M.D.   On: 12/23/2021 20:38   ? ?MAU Course  ?Procedures ?Lab Orders    ?     Wet prep, genital    ?     Urinalysis, Routine w reflex microscopic Urine, Clean Catch    ?     CBC    ?     hCG, quantitative, pregnancy    ?  Pregnancy, urine POC    ?No orders of the defined types were placed in this  encounter. ? ?Imaging Orders    ?     US OB LESS THAN 14 WEEKS WITH OB TRANSVAGINAL    ? ? ?MDM ?+UPT ?UA, wet prep, GC/chlamydia, CBC, ABO/Rh, quant hCG, and [place] today to rule out ectopic pregnancy which can be life threatening.  ? ?Wet prep positive for clue cells. No abnormal discharge. Doesn't meet criteria for BV dx.  ? ?Ultrasound shows empty intrauterine gestational sac. Right corpus luteal cyst. No other adnexal masses. HCG today is 5279 ? ?Reviewed images with Dr. Despina Hidden. Appears to be IUGS. Recommends follow up HCG in 72 hours to assess for SAB.  ? ?Dr. Mindi Slicker notified of recommendation. Will send message to [facility] to schedule. Since patient is not going to them until later this month, recommends scheduling in our office - patient can cancel that appointment if [facility] able to see her this week.  ?Assessment and Plan  ? ?1. Pregnancy of unknown anatomic location   ?2. [redacted] weeks gestation of pregnancy   ? ?-Reviewed bleeding precautions & reasons to return to MAU ?-Scheduled for stat HCG on Thursday at [facility] - patient will call in cancel if she hears from [facility] before then.  ? ?Judeth Horn, NP ?12/23/21 ?9:39 PM ? ? ?

## 2021-12-23 NOTE — MAU Note (Signed)
Sydney Allison is a 31 y.o. at Unknown here in MAU reporting: +HPT, has not been seen or had confirmed. Been having ovarian cysts for about a year, has been noted increase in pain in LLQ and feeling really tight.  Started cramping late last night and started spotting this morning.  ?LMP: 2/14 ?Onset of complaint: 4days ?Pain score: 6/5 ?Vitals:  ? 12/23/21 1852  ?BP: 120/68  ?Pulse: 89  ?Resp: 18  ?Temp: 98.2 ?F (36.8 ?C)  ?SpO2: 98%  ?   ? ?Lab orders placed from triage:  urine preg, UA if + ?

## 2021-12-24 LAB — GC/CHLAMYDIA PROBE AMP (~~LOC~~) NOT AT ARMC
Chlamydia: NEGATIVE
Comment: NEGATIVE
Comment: NORMAL
Neisseria Gonorrhea: NEGATIVE

## 2021-12-26 ENCOUNTER — Encounter: Payer: Self-pay | Admitting: *Deleted

## 2021-12-26 ENCOUNTER — Ambulatory Visit: Payer: Medicaid Other

## 2021-12-26 ENCOUNTER — Telehealth: Payer: Self-pay | Admitting: *Deleted

## 2021-12-26 NOTE — Telephone Encounter (Signed)
Patient [facility] stat bhcg appointment today. Per chart review planning to go to [facility] and was going to see if they could see her this week and then let us know to cancel.  ?I called patient and left message I was calling because you missed scheduled appointment and important to reschedule. Also if you were able to schedule with other provider , please let us know by call or message. Will also send Mychart message. ?Nancy Fetter ?

## 2021-12-27 ENCOUNTER — Other Ambulatory Visit: Payer: Self-pay

## 2021-12-27 ENCOUNTER — Inpatient Hospital Stay (HOSPITAL_COMMUNITY)
Admission: AD | Admit: 2021-12-27 | Discharge: 2021-12-27 | Disposition: A | Discharge status: Home / Self Care | Payer: Medicaid Other | Attending: Obstetrics & Gynecology | Admitting: Obstetrics & Gynecology

## 2021-12-27 ENCOUNTER — Encounter: Payer: Self-pay | Admitting: Advanced Practice Midwife

## 2021-12-27 ENCOUNTER — Telehealth: Payer: Self-pay | Admitting: Advanced Practice Midwife

## 2021-12-27 DIAGNOSIS — O3680X0 Pregnancy with inconclusive fetal viability, not applicable or unspecified: Secondary | ICD-10-CM | POA: Diagnosis not present

## 2021-12-27 DIAGNOSIS — Z3A01 Less than 8 weeks gestation of pregnancy: Secondary | ICD-10-CM | POA: Diagnosis not present

## 2021-12-27 DIAGNOSIS — O26891 Other specified pregnancy related conditions, first trimester: Secondary | ICD-10-CM | POA: Diagnosis present

## 2021-12-27 LAB — HCG, QUANTITATIVE, PREGNANCY: hCG, Beta Chain, Quant, S: 19335 m[IU]/mL — ABNORMAL HIGH (ref <5)

## 2021-12-27 NOTE — MAU Note (Signed)
Sydney Allison is a 31 y.o. at [redacted]w[redacted]d here in MAU reporting: was unable to go to lab appointment yesterday due to family emergency- is here now for follow up. No pain or bleeding today. ? ?Onset of complaint: ongoing ? ?Pain score: 0/10 ? ?Vitals:  ? 12/27/21 1433  ?BP: 104/63  ?Pulse: 67  ?Resp: 16  ?Temp: 99 ?F (37.2 ?C)  ?SpO2: 98%  ?   ?Lab orders placed from triage: none ? ?

## 2021-12-27 NOTE — Telephone Encounter (Signed)
Attempted to reach patient to discuss Quant hCG results. VLTCB ? ?Clayton Bibles, MSA, MSN, CNM ?Certified Nurse Midwife, Faculty Practice ?Center for [employer], [facility] ? ?

## 2021-12-27 NOTE — MAU Provider Note (Signed)
Event Date/Time  ? First Provider Initiated Contact with Patient 12/27/21 1435   ?  ? ?S ?Sydney Allison is a 31 y.o. [id number] patient who presents to MAU today for repeat quant. She was unable to attend her office appointment for this lab due to a family emergency. She denies complaints including abdominal pain and vaginal bleeding.  ? ?Patient requests discharge home prior to receipt of results. ? ?O ?BP 104/63 (BP Location: Right Arm)   Pulse 67   Temp 99 ?F (37.2 ?C) (Oral)   Resp 16   LMP 11/19/2021   SpO2 98% Comment: room air ? ?Physical Exam ?Vitals and nursing note reviewed.  ?Constitutional:   ?   Appearance: Normal appearance.  ?Cardiovascular:  ?   Rate and Rhythm: Normal rate.  ?Pulmonary:  ?   Effort: Pulmonary effort is normal.  ?Neurological:  ?   Mental Status: She is alert and oriented to person, place, and time.  ?Psychiatric:     ?   Mood and Affect: Mood normal.     ?   Behavior: Behavior normal.     ?   Thought Content: Thought content normal.     ?   Judgment: Judgment normal.  ? ? ?A ?Medical screening exam complete ?Appropriate rise in quant hCG ?See separate documentation of attempt to discuss results by phone and MyChart ? ?Component ?    Latest Ref Rng 12/23/2021 12/27/2021  ?HCG, Beta Chain, Quant, S ?    <5 mIU/mL 5,279 (H)  19,335 (H)   ?  ?P ?Discharge from MAU in stable condition ? ?F/U ?Order placed for outpatient ultrasound in two weeks ? ?Clayton Bibles, CNM ?12/27/2021 4:24 PM  ? ?

## 2022-01-07 ENCOUNTER — Emergency Department (HOSPITAL_COMMUNITY): Payer: Medicaid Other

## 2022-01-07 ENCOUNTER — Encounter (HOSPITAL_COMMUNITY): Payer: Self-pay | Admitting: Emergency Medicine

## 2022-01-07 ENCOUNTER — Emergency Department (HOSPITAL_COMMUNITY)
Admission: EM | Admit: 2022-01-07 | Discharge: 2022-01-07 | Disposition: A | Discharge status: Home / Self Care | Payer: Medicaid Other | Attending: Emergency Medicine | Admitting: Emergency Medicine

## 2022-01-07 DIAGNOSIS — M545 Low back pain, unspecified: Secondary | ICD-10-CM | POA: Diagnosis not present

## 2022-01-07 DIAGNOSIS — Y92410 Unspecified street and highway as the place of occurrence of the external cause: Secondary | ICD-10-CM | POA: Diagnosis not present

## 2022-01-07 DIAGNOSIS — O9A211 Injury, poisoning and certain other consequences of external causes complicating pregnancy, first trimester: Secondary | ICD-10-CM | POA: Insufficient documentation

## 2022-01-07 DIAGNOSIS — R109 Unspecified abdominal pain: Secondary | ICD-10-CM | POA: Insufficient documentation

## 2022-01-07 DIAGNOSIS — O26891 Other specified pregnancy related conditions, first trimester: Secondary | ICD-10-CM | POA: Insufficient documentation

## 2022-01-07 DIAGNOSIS — S0502XA Injury of conjunctiva and corneal abrasion without foreign body, left eye, initial encounter: Secondary | ICD-10-CM | POA: Insufficient documentation

## 2022-01-07 DIAGNOSIS — Z3A01 Less than 8 weeks gestation of pregnancy: Secondary | ICD-10-CM | POA: Insufficient documentation

## 2022-01-07 DIAGNOSIS — R0789 Other chest pain: Secondary | ICD-10-CM | POA: Insufficient documentation

## 2022-01-07 LAB — COMPREHENSIVE METABOLIC PANEL
ALT: 24 U/L (ref 0–44)
AST: 38 U/L (ref 15–41)
Albumin: 4.1 g/dL (ref 3.5–5.0)
Alkaline Phosphatase: 39 U/L (ref 38–126)
Anion gap: 10 (ref 5–15)
BUN: 7 mg/dL (ref 6–20)
CO2: 19 mmol/L — ABNORMAL LOW (ref 22–32)
Calcium: 9.1 mg/dL (ref 8.9–10.3)
Chloride: 110 mmol/L (ref 98–111)
Creatinine, Ser: 0.61 mg/dL (ref 0.44–1.00)
GFR, Estimated: >60 mL/min (ref >60)
Glucose, Bld: 107 mg/dL — ABNORMAL HIGH (ref 70–99)
Potassium: 4 mmol/L (ref 3.5–5.1)
Sodium: 139 mmol/L (ref 135–145)
Total Bilirubin: 0.6 mg/dL (ref 0.3–1.2)
Total Protein: 6.6 g/dL (ref 6.5–8.1)

## 2022-01-07 LAB — CBC WITH DIFFERENTIAL/PLATELET
Abs Immature Granulocytes: 0.04 10*3/uL (ref 0.00–0.07)
Basophils Absolute: 0 10*3/uL (ref 0.0–0.1)
Basophils Relative: 1 %
Eosinophils Absolute: 0.2 10*3/uL (ref 0.0–0.5)
Eosinophils Relative: 3 %
HCT: 42.2 % (ref 36.0–46.0)
Hemoglobin: 15.1 g/dL — ABNORMAL HIGH (ref 12.0–15.0)
Immature Granulocytes: 1 %
Lymphocytes Relative: 21 %
Lymphs Abs: 1.2 10*3/uL (ref 0.7–4.0)
MCH: 32 pg (ref 26.0–34.0)
MCHC: 35.8 g/dL (ref 30.0–36.0)
MCV: 89.4 fL (ref 80.0–100.0)
Monocytes Absolute: 0.5 10*3/uL (ref 0.1–1.0)
Monocytes Relative: 8 %
Neutro Abs: 3.9 10*3/uL (ref 1.7–7.7)
Neutrophils Relative %: 66 %
Platelets: 172 10*3/uL (ref 150–400)
RBC: 4.72 MIL/uL (ref 3.87–5.11)
RDW: 11.5 % (ref 11.5–15.5)
WBC: 5.8 10*3/uL (ref 4.0–10.5)
nRBC: 0 % (ref 0.0–0.2)

## 2022-01-07 MED ORDER — FLUORESCEIN SODIUM 1 MG OP STRP
1.0000 | ORAL_STRIP | Freq: Once | OPHTHALMIC | Status: AC
  Administered 2022-01-07: 1 via OPHTHALMIC
  Filled 2022-01-07: qty 1

## 2022-01-07 MED ORDER — ERYTHROMYCIN 5 MG/GM OP OINT: TOPICAL_OINTMENT | OPHTHALMIC | 0 refills | Status: DC

## 2022-01-07 MED ORDER — ERYTHROMYCIN 5 MG/GM OP OINT
1.0000 "application " | TOPICAL_OINTMENT | Freq: Once | OPHTHALMIC | Status: DC
  Filled 2022-01-07 (×2): qty 3.5

## 2022-01-07 MED ORDER — ACETAMINOPHEN 500 MG PO TABS
1000.0000 mg | ORAL_TABLET | Freq: Once | ORAL | Status: AC
  Administered 2022-01-07: 1000 mg via ORAL
  Filled 2022-01-07: qty 2

## 2022-01-07 MED ORDER — KETOROLAC TROMETHAMINE 0.5 % OP SOLN: 1.0000 [drp] | Freq: Three times a day (TID) | OPHTHALMIC | 0 refills | Status: AC | PRN

## 2022-01-07 MED ORDER — TETRACAINE HCL 0.5 % OP SOLN
2.0000 [drp] | Freq: Once | OPHTHALMIC | Status: AC
  Administered 2022-01-07: 2 [drp] via OPHTHALMIC
  Filled 2022-01-07: qty 4

## 2022-01-07 NOTE — Discharge Instructions (Addendum)
Please call today schedule follow-up appointment with an ophthalmologist for your corneal abrasion.  You will use erythromycin ointment 4 times a day for the next 7 days as we instructed.  I would advise that you use the ketorolac drops as little as possible, as needed for pain control for the next 1 to 3 days, given what we discussed about the potential risk of NSAIDs to early pregnancy. ? ?It is also possible that this trauma may induce a miscarriage over the next several days.  I included information for you read over what to expect if you experience a miscarriage. ?
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
## 2022-02-18 ENCOUNTER — Ambulatory Visit
Admission: RE | Admit: 2022-02-18 | Discharge: 2022-02-18 | Disposition: A | Discharge status: Home / Self Care | Payer: Medicaid Other | Source: Ambulatory Visit | Attending: Emergency Medicine | Admitting: Emergency Medicine

## 2022-02-18 VITALS — BP 111/73 | HR 64 | Temp 99.5°F | Resp 18

## 2022-02-18 DIAGNOSIS — S93491A Sprain of other ligament of right ankle, initial encounter: Secondary | ICD-10-CM

## 2022-02-18 MED ORDER — DICLOFENAC SODIUM 1 % EX GEL: 4.0000 g | Freq: Four times a day (QID) | CUTANEOUS | 2 refills | Status: AC

## 2022-02-18 MED ORDER — IBUPROFEN 600 MG PO TABS: 600.0000 mg | ORAL_TABLET | Freq: Three times a day (TID) | ORAL | 0 refills | Status: AC | PRN

## 2022-02-18 NOTE — ED Provider Notes (Signed)
?[facility] ? ? ? ?CSN: [id number] ?Arrival date & time: 02/18/22  1824 ?  ? ?HISTORY  ? ?Chief Complaint  ?Patient presents with  ? Ankle Pain  ?  Twisted ankle - Entered by patient  ? ?HPI ?Sydney Allison is a 31 y.o. female. Pt states 2 days ago, while jumping on the trampoline, she landed awkwardly on her right ankle causing it to roll outward.  Patient states she does have some pain with ambulation but was able to ambulate independently into the clinic, she is wearing hightop basketball shoes with thick socks for support.  Patient states she has not tried any other interventions for her injury.  Patient does report a history of frequent swelling of her feet and ankles but does not know the underlying reason for this.  Patient denies previous traumatic injury to right ankle. ? ?The history is provided by the patient.  ?Past Medical History:  ?Diagnosis Date  ? Headache(784.0)   ? ?Patient Active Problem List  ? Diagnosis Date Noted  ? Mood disorder (HCC) 04/17/2013  ? PTSD (post-traumatic stress disorder) 04/17/2013  ? ?Past Surgical History:  ?Procedure Laterality Date  ? NO PAST SURGERIES    ? ?OB History   ? ? Gravida  ?3  ? Para  ?2  ? Term  ?2  ? Preterm  ?   ? AB  ?   ? Living  ?2  ?  ? ? SAB  ?   ? IAB  ?   ? Ectopic  ?   ? Multiple  ?0  ? Live Births  ?2  ?   ?  ?  ? ?Home Medications   ? ?Prior to Admission medications   ?Medication Sig Start Date End Date Taking? Authorizing Provider  ?erythromycin ophthalmic ointment Place a 1/2 inch ribbon of ointment into the lower eyelid of the left eye, four times per day, for 7 days 01/07/22   Terald Sleeper, MD  ? ? ?Family History ?Family History  ?Problem Relation Age of Onset  ? Cancer Mother   ? Depression Mother   ? Hypertension Mother   ? Alcohol abuse Father   ? Miscarriages / Stillbirths Sister   ? Diabetes Maternal Aunt   ? Diabetes Maternal Uncle   ? Obesity Maternal Uncle   ? Diabetes Paternal Aunt   ? Diabetes Paternal Uncle    ? ?Social History ?Social History  ? ?Tobacco Use  ? Smoking status: Never  ? Smokeless tobacco: Never  ?Vaping Use  ? Vaping Use: Never used  ?Substance Use Topics  ? Alcohol use: No  ? Drug use: No  ? ?Allergies   ?Latex and Bee venom ? ?Review of Systems ?Review of Systems ?Pertinent findings noted in history of present illness.  ? ?Physical Exam ?Triage Vital Signs ?ED Triage Vitals  ?Enc Vitals Group  ?   BP 08/02/21 0827 (!) 147/82  ?   Pulse Rate 08/02/21 0827 72  ?   Resp 08/02/21 0827 18  ?   Temp 08/02/21 0827 98.3 ?F (36.8 ?C)  ?   Temp Source 08/02/21 0827 Oral  ?   SpO2 08/02/21 0827 98 %  ?   Weight --   ?   Height --   ?   Head Circumference --   ?   Peak Flow --   ?   Pain Score 08/02/21 0826 5  ?   Pain Loc --   ?   Pain Edu? --   ?  Excl. in GC? --   ?No data found. ? ?Updated Vital Signs ?BP 111/73 (BP Location: Right Arm)   Pulse 64   Temp 99.5 ?F (37.5 ?C) (Oral)   Resp 18   LMP 11/19/2021   SpO2 98%   Breastfeeding No Comment: pt states she recently had an abortion ? ?Physical Exam ?Musculoskeletal:  ?   Right ankle: No swelling, deformity, ecchymosis or lacerations. Tenderness present over the ATF ligament. Normal range of motion. Anterior drawer test negative. Normal pulse.  ?   Right Achilles Tendon: Tenderness (Mild with deep palpation) present.  ?   Left ankle: Normal.  ?   Left Achilles Tendon: Normal.  ?   Right foot: Decreased range of motion. Normal capillary refill. Tenderness present. No swelling, deformity or bony tenderness. Normal pulse.  ?   Left foot: Normal. No swelling or deformity.  ?   Comments: Pain in the right foot with eversion  ? ? ?Visual Acuity ?Right Eye Distance:   ?Left Eye Distance:   ?Bilateral Distance:   ? ?Right Eye Near:   ?Left Eye Near:    ?Bilateral Near:    ? ?UC Couse / Diagnostics / Procedures:  ?  ?EKG ? ?Radiology ?No results found. ? ?Procedures ?Procedures (including critical care time) ? ?UC Diagnoses / Final Clinical Impressions(s)   ?I  have reviewed the triage vital signs and the nursing notes. ? ?Pertinent labs & imaging results that were available during my care of the patient were reviewed by me and considered in my medical decision making (see chart for details).   ? ?Final diagnoses:  ?Sprain of anterior talofibular ligament of right ankle, initial encounter  ? ?Patient provided with an ASO stabilize ankle brace and advised to wear for the next few weeks.  Patient advised to follow-up with orthopedics in the next 3 to 4 days if not improved.  Patient provided with a note for work.  Patient provided with a prescription for ibuprofen and Voltaren gel.  Patient advised to ice her foot ais much as possible and keep it elevated while not working. ?ED Prescriptions   ? ? Medication Sig Dispense Auth. Provider  ? ibuprofen (ADVIL) 600 MG tablet Take 1 tablet (600 mg total) by mouth every 8 (eight) hours as needed for up to 30 doses for fever, headache, mild pain or moderate pain (Inflammation). Take 1 tablet 3 times daily as needed for inflammation of upper airways and/or pain. 30 tablet Theadora Rama Scales, PA-C  ? diclofenac Sodium (VOLTAREN) 1 % GEL Apply 4 g topically 4 (four) times daily. Apply to affected areas 4 times daily as needed for pain. 100 g Theadora Rama Scales, PA-C  ? ?  ? ?PDMP not reviewed this encounter. ? ?Pending results:  ?Labs Reviewed - No data to display ? ?Medications Ordered in UC: ?Medications - No data to display ? ?Disposition Upon Discharge:  ?Condition: stable for discharge home ?Home: take medications as prescribed; routine discharge instructions as discussed; follow up as advised. ? ?Patient presented with an acute illness with associated systemic symptoms and significant discomfort requiring urgent management. In my opinion, this is a condition that a prudent lay person (someone who possesses an average knowledge of health and medicine) may potentially expect to result in complications if not addressed  urgently such as respiratory distress, impairment of bodily function or dysfunction of bodily organs.  ? ?Routine symptom specific, illness specific and/or disease specific instructions were discussed with the patient and/or caregiver at length.  ? ?  As such, the patient has been evaluated and assessed, work-up was performed and treatment was provided in alignment with urgent care protocols and evidence based medicine.  Patient/parent/caregiver has been advised that the patient may require follow up for further testing and treatment if the symptoms continue in spite of treatment, as clinically indicated and appropriate. ? ?Patient/parent/caregiver has been advised to return to the [facility] or PCP if no better; to PCP or the Emergency Department if new signs and symptoms develop, or if the current signs or symptoms continue to change or worsen for further workup, evaluation and treatment as clinically indicated and appropriate ? ?The patient will follow up with their current PCP if and as advised. If the patient does not currently have a PCP we will assist them in obtaining one.  ? ?The patient may need specialty follow up if the symptoms continue, in spite of conservative treatment and management, for further workup, evaluation, consultation and treatment as clinically indicated and appropriate. ? ? ?Patient/parent/caregiver verbalized understanding and agreement of plan as discussed.  All questions were addressed during visit.  Please see discharge instructions below for further details of plan. ? ?Discharge Instructions: ? ? ?Discharge Instructions   ? ?  ?Please read the enclosed information regarding ankle sprains and how to rehabilitate an ankle sprain. ? ?I recommend that you remain in the stabilizing ankle brace that we provided to you during your visit today for the next few weeks.   ? ?I provided you with a note to be out of work for 3 days.  During this time, please keep your foot elevated and ice it is much as  possible.  You are also welcome to try topical Voltaren gel or ibuprofen for pain. ? ?In 3 to 4 days, if you have not had any improvement of your pain, please consider going to an orthopedic specialist such as [facility]

## 2022-02-18 NOTE — Discharge Instructions (Addendum)
Please read the enclosed information regarding ankle sprains and how to rehabilitate an ankle sprain. ? ?I recommend that you remain in the stabilizing ankle brace that we provided to you during your visit today for the next few weeks.   ? ?I provided you with a note to be out of work for 3 days.  During this time, please keep your foot elevated and ice it is much as possible.  You are also welcome to try topical Voltaren gel or ibuprofen for pain. ? ?In 3 to 4 days, if you have not had any improvement of your pain, please consider going to an orthopedic specialist such as [facility] where they have an urgent care walk-in clinic which does not require an appointment. ? ?Thank you for visiting urgent care today. ?

## 2022-02-18 NOTE — ED Triage Notes (Signed)
Pt states Sunday while jumping on the trampoline she fell wrong on her right ankle. She states she does have some pain with ambulation.  ?

## 2023-09-29 ENCOUNTER — Ambulatory Visit
Admission: EM | Admit: 2023-09-29 | Discharge: 2023-09-29 | Disposition: A | Discharge status: Home / Self Care | Payer: Medicaid Other | Attending: Emergency Medicine | Admitting: Emergency Medicine

## 2023-09-29 DIAGNOSIS — J069 Acute upper respiratory infection, unspecified: Secondary | ICD-10-CM

## 2023-09-29 DIAGNOSIS — J029 Acute pharyngitis, unspecified: Secondary | ICD-10-CM | POA: Diagnosis not present

## 2023-09-29 LAB — POCT RAPID STREP A (OFFICE): Rapid Strep A Screen: NEGATIVE

## 2023-09-29 MED ORDER — ACETAMINOPHEN 325 MG PO TABS
650.0000 mg | ORAL_TABLET | Freq: Once | ORAL | Status: AC
  Administered 2023-09-29: 650 mg via ORAL

## 2023-09-29 NOTE — ED Provider Notes (Signed)
Renaldo Fiddler    CSN: [id number] Arrival date & time: 09/29/23  1423      History   Chief Complaint Chief Complaint  Patient presents with   Otalgia   Fever   Headache    HPI Sydney Allison is a 32 y.o. female.  Patient presents with 1 week history of cough subjective fever, headache, sore throat, ear pain, mild cough.  She has hoarse voice since last night.  No OTC medication taken today.  No chest pain or shortness of breath.  No vomiting or diarrhea.   The history is provided by the patient and medical records.    Past Medical History:  Diagnosis Date   Headache(784.0)     Patient Active Problem List   Diagnosis Date Noted   Mood disorder (HCC) 04/17/2013   PTSD (post-traumatic stress disorder) 04/17/2013    Past Surgical History:  Procedure Laterality Date   NO PAST SURGERIES      OB History     Gravida  3   Para  2   Term  2   Preterm      AB      Living  2      SAB      IAB      Ectopic      Multiple  0   Live Births  2            Home Medications    Prior to Admission medications   Medication Sig Start Date End Date Taking? Authorizing Provider  diclofenac Sodium (VOLTAREN) 1 % GEL Apply 4 g topically 4 (four) times daily. Apply to affected areas 4 times daily as needed for pain. 02/18/22   Theadora Rama Scales, PA-C  ibuprofen (ADVIL) 600 MG tablet Take 1 tablet (600 mg total) by mouth every 8 (eight) hours as needed for up to 30 doses for fever, headache, mild pain or moderate pain (Inflammation). Take 1 tablet 3 times daily as needed for inflammation of upper airways and/or pain. Patient not taking: Reported on 09/29/2023 02/18/22   Theadora Rama Scales, PA-C    Family History Family History  Problem Relation Age of Onset   Cancer Mother    Depression Mother    Hypertension Mother    Alcohol abuse Father    Miscarriages / [place] Sister    Diabetes Maternal Aunt    Diabetes Maternal Uncle     Obesity Maternal Uncle    Diabetes Paternal Aunt    Diabetes Paternal Uncle     Social History Social History   Tobacco Use   Smoking status: Never   Smokeless tobacco: Never  Vaping Use   Vaping status: Never Used  Substance Use Topics   Alcohol use: No   Drug use: No     Allergies   Latex and Bee venom   Review of Systems Review of Systems  Constitutional:  Positive for chills and fever.  HENT:  Positive for ear pain and sore throat.   Respiratory:  Positive for cough. Negative for shortness of breath.   Cardiovascular:  Negative for chest pain and palpitations.  Neurological:  Positive for headaches.     Physical Exam Triage Vital Signs ED Triage Vitals  Encounter Vitals Group     BP      Systolic BP Percentile      Diastolic BP Percentile      Pulse      Resp      Temp  Temp src      SpO2      Weight      Height      Head Circumference      Peak Flow      Pain Score      Pain Loc      Pain Education      Exclude from Growth Chart    No data found.  Updated Vital Signs BP 110/69   Pulse (!) 107   Temp 100.1 F (37.8 C)   Resp 18   SpO2 98%   Visual Acuity Right Eye Distance:   Left Eye Distance:   Bilateral Distance:    Right Eye Near:   Left Eye Near:    Bilateral Near:     Physical Exam Constitutional:      General: She is not in acute distress. HENT:     Right Ear: Tympanic membrane normal.     Left Ear: Tympanic membrane normal.     Nose: Nose normal.     Mouth/Throat:     Mouth: Mucous membranes are moist.     Pharynx: Posterior oropharyngeal erythema present.  Cardiovascular:     Rate and Rhythm: Normal rate and regular rhythm.     Heart sounds: Normal heart sounds.  Pulmonary:     Effort: Pulmonary effort is normal. No respiratory distress.     Breath sounds: Normal breath sounds.  Skin:    General: Skin is warm and dry.  Neurological:     Mental Status: She is alert.      UC Treatments / Results   Labs (all labs ordered are listed, but only abnormal results are displayed) Labs Reviewed  POCT RAPID STREP A (OFFICE)    EKG   Radiology No results found.  Procedures Procedures (including critical care time)  Medications Ordered in UC Medications  acetaminophen (TYLENOL) tablet 650 mg (650 mg Oral Given 09/29/23 1546)    Initial Impression / Assessment and Plan / UC Course  I have reviewed the triage vital signs and the nursing notes.  Pertinent labs & imaging results that were available during my care of the patient were reviewed by me and considered in my medical decision making (see chart for details).    Viral URI, viral pharyngitis.  Rapid strep negative.  Discussed symptomatic treatment including Tylenol or ibuprofen as needed for fever or discomfort, plain Mucinex as needed for congestion, rest, hydration.  Instructed patient to follow-up with PCP if not improving.  ED precautions given.  Patient agrees to plan of care.   Final Clinical Impressions(s) / UC Diagnoses   Final diagnoses:  Viral URI  Viral pharyngitis     Discharge Instructions      Your strep test is negative.    Take Tylenol or ibuprofen as needed for fever or discomfort.  Take plain Mucinex as needed for congestion.  Rest and keep yourself hydrated.    Follow-up with your primary care provider if your symptoms are not improving.         ED Prescriptions   None    PDMP not reviewed this encounter.   Mickie Bail, NP 09/29/23 [phone]

## 2023-09-29 NOTE — ED Triage Notes (Signed)
Patient to Urgent Care with complaints of ear pain/ headaches/ fever.  Reports symptoms started 1 week ago. Reports she lost her voice over night/ developed body aches.  Meds: tylenol

## 2023-09-29 NOTE — Discharge Instructions (Signed)
Your strep test is negative.    Take Tylenol or ibuprofen as needed for fever or discomfort.  Take plain Mucinex as needed for congestion.  Rest and keep yourself hydrated.    Follow-up with your primary care provider if your symptoms are not improving.
